# Patient Record
Sex: Male | Born: 1975 | Race: Black or African American | Hispanic: No | Smoking: Former smoker
Health system: Southern US, Community
[De-identification: ages and names within clinical notes are randomized; demographics above are authoritative.]

## PROBLEM LIST (undated history)

## (undated) DIAGNOSIS — E785 Hyperlipidemia, unspecified: Secondary | ICD-10-CM

## (undated) HISTORY — DX: Hyperlipidemia, unspecified: E78.5

---

## 2013-03-13 ENCOUNTER — Other Ambulatory Visit (HOSPITAL_COMMUNITY): Payer: Self-pay | Admitting: Physician Assistant

## 2013-03-13 ENCOUNTER — Ambulatory Visit (HOSPITAL_COMMUNITY)
Admission: RE | Admit: 2013-03-13 | Discharge: 2013-03-13 | Disposition: A | Discharge status: Home / Self Care | Payer: BC Managed Care – PPO | Source: Ambulatory Visit | Attending: Physician Assistant | Admitting: Physician Assistant

## 2013-03-13 DIAGNOSIS — Z87891 Personal history of nicotine dependence: Secondary | ICD-10-CM | POA: Insufficient documentation

## 2013-03-13 DIAGNOSIS — R7611 Nonspecific reaction to tuberculin skin test without active tuberculosis: Secondary | ICD-10-CM

## 2014-02-24 ENCOUNTER — Encounter: Payer: Self-pay | Admitting: *Deleted

## 2014-03-10 ENCOUNTER — Ambulatory Visit (INDEPENDENT_AMBULATORY_CARE_PROVIDER_SITE_OTHER): Payer: BC Managed Care – PPO | Admitting: Family Medicine

## 2014-03-10 ENCOUNTER — Encounter: Payer: Self-pay | Admitting: Family Medicine

## 2014-03-10 VITALS — BP 126/78 | HR 68 | Temp 98.1°F | Resp 18 | Ht 67.5 in | Wt 203.0 lb

## 2014-03-10 DIAGNOSIS — Z0000 Encounter for general adult medical examination without abnormal findings: Secondary | ICD-10-CM

## 2014-03-10 DIAGNOSIS — K219 Gastro-esophageal reflux disease without esophagitis: Secondary | ICD-10-CM

## 2014-03-10 DIAGNOSIS — E785 Hyperlipidemia, unspecified: Secondary | ICD-10-CM

## 2014-03-10 DIAGNOSIS — E669 Obesity, unspecified: Secondary | ICD-10-CM

## 2014-03-10 LAB — CBC WITH DIFFERENTIAL/PLATELET
BASOS PCT: 0 % (ref 0–1)
Basophils Absolute: 0 10*3/uL (ref 0.0–0.1)
EOS ABS: 0.1 10*3/uL (ref 0.0–0.7)
Eosinophils Relative: 3 % (ref 0–5)
HCT: 43.6 % (ref 39.0–52.0)
Hemoglobin: 14.8 g/dL (ref 13.0–17.0)
Lymphocytes Relative: 43 % (ref 12–46)
Lymphs Abs: 2.1 10*3/uL (ref 0.7–4.0)
MCH: 29.4 pg (ref 26.0–34.0)
MCHC: 33.9 g/dL (ref 30.0–36.0)
MCV: 86.5 fL (ref 78.0–100.0)
Monocytes Absolute: 0.5 10*3/uL (ref 0.1–1.0)
Monocytes Relative: 10 % (ref 3–12)
NEUTROS PCT: 44 % (ref 43–77)
Neutro Abs: 2.1 10*3/uL (ref 1.7–7.7)
PLATELETS: 252 10*3/uL (ref 150–400)
RBC: 5.04 MIL/uL (ref 4.22–5.81)
RDW: 14.7 % (ref 11.5–15.5)
WBC: 4.8 10*3/uL (ref 4.0–10.5)

## 2014-03-10 LAB — COMPREHENSIVE METABOLIC PANEL
ALT: 76 U/L — AB (ref 0–53)
AST: 53 U/L — AB (ref 0–37)
Albumin: 4.8 g/dL (ref 3.5–5.2)
Alkaline Phosphatase: 56 U/L (ref 39–117)
BILIRUBIN TOTAL: 0.9 mg/dL (ref 0.2–1.2)
BUN: 13 mg/dL (ref 6–23)
CO2: 25 mEq/L (ref 19–32)
Calcium: 9.7 mg/dL (ref 8.4–10.5)
Chloride: 103 mEq/L (ref 96–112)
Creat: 1.3 mg/dL (ref 0.50–1.35)
Glucose, Bld: 96 mg/dL (ref 70–99)
Potassium: 4.3 mEq/L (ref 3.5–5.3)
Sodium: 137 mEq/L (ref 135–145)
Total Protein: 7.3 g/dL (ref 6.0–8.3)

## 2014-03-10 LAB — LIPID PANEL
CHOL/HDL RATIO: 7.7 ratio
Cholesterol: 285 mg/dL — ABNORMAL HIGH (ref 0–200)
HDL: 37 mg/dL — ABNORMAL LOW (ref >39)
LDL CALC: 190 mg/dL — AB (ref 0–99)
Triglycerides: 292 mg/dL — ABNORMAL HIGH (ref <150)
VLDL: 58 mg/dL — AB (ref 0–40)

## 2014-03-10 MED ORDER — OMEPRAZOLE 40 MG PO CPDR: 40.0000 mg | DELAYED_RELEASE_CAPSULE | Freq: Every day | ORAL | Status: DC

## 2014-03-10 NOTE — Assessment & Plan Note (Signed)
We'll recheck his lipid panel decide on a statin drugs are needed. For now he can continue the niacin and fish oil

## 2014-03-10 NOTE — Patient Instructions (Signed)
I recommend eye visit once a year I recommend dental visit every 6 months Goal is to  Exercise 30 minutes 5 days a week We will send a letter with lab results  F/U as needed or in 6 months

## 2014-03-10 NOTE — Assessment & Plan Note (Signed)
Will have him use Prilosec 40 mg as needed, is already avoiding foods that cause irritation

## 2014-03-10 NOTE — Progress Notes (Signed)
Patient ID: Bob Phillips, male   DOB: 14-Dec-1975, 38 y.o.   MRN: [id number]   Subjective:    Patient ID: Bob Phillips, male    DOB: 14-Dec-1975, 38 y.o.   MRN: [id number]  Patient presents for New Patient CPE and Occasional Heart burn  patient here to establish care. [facility] he was also seen at the health department. He is currently in school for respiratory therapy. His [place] country is [place] His history of hyperlipidemia he states that he was told to take niacin and visual but it was not rechecked. He remembers his cholesterol was severely high as well as his LDL. He does have family history of coronary artery disease his father died with a myocardial infarction at age 36 his sisters also have hypertension. He's been trying to watch his diet and he started exercising again to lose some weight.  Acid reflux he's had heartburn problems on and off for quite some time. He has tried some over-the-counter Prilosec 20 mg which helps some. She tries to avoid the foods that irritation is acid reflux such as R. and red sauces. He denies any nausea vomiting denies any diarrhea.  Medications and history reviewed    Review Of Systems:  GEN- denies fatigue, fever, weight loss,weakness, recent illness HEENT- denies eye drainage, change in vision, nasal discharge, CVS- denies chest pain, palpitations RESP- denies SOB, cough, wheeze ABD- denies N/V, change in stools, abd pain GU- denies dysuria, hematuria, dribbling, incontinence MSK- denies joint pain, muscle aches, injury Neuro- denies headache, dizziness, syncope, seizure activity       Objective:    BP 126/78  Pulse 68  Temp(Src) 98.1 F (36.7 C) (Oral)  Resp 18  Ht 5' 7.5" (1.715 m)  Wt 203 lb (92.08 kg)  BMI 31.31 kg/m2 GEN- NAD, alert and oriented x3 HEENT- PERRL, EOMI, non injected sclera, pink conjunctiva, MMM, oropharynx clear, TM clear bilat no effusion Neck- Supple, no LAD CVS- RRR, no  murmur RESP-CTAB ABD-NABS,soft,NT,ND EXT- No edema Pulses- Radial 2+ Psych-normal affect and mood        Assessment & Plan:      Problem List Items Addressed This Visit   Routine general medical examination at a health care facility - Primary   Relevant Orders      CBC with Differential      Comprehensive metabolic panel   Obesity, unspecified   Hyperlipidemia   Relevant Orders      Lipid panel      Note: This dictation was prepared with Dragon dictation along with smaller phrase technology. Any transcriptional errors that result from this process are unintentional.

## 2014-03-10 NOTE — Assessment & Plan Note (Signed)
Fasting labs done. His tetanus booster immunizations are up to date. No family history of colon cancer or prostate cancer therefore he can wait until 7250 for colon cancer screening at age 38 for prostate cancer screening

## 2014-03-10 NOTE — Assessment & Plan Note (Signed)
Discussed dietary changes as well as exercise which he is already doing. His goal be to lose about 15-20 pound

## 2014-03-15 ENCOUNTER — Other Ambulatory Visit: Payer: Self-pay | Admitting: *Deleted

## 2014-03-15 MED ORDER — ATORVASTATIN CALCIUM 20 MG PO TABS: 20.0000 mg | ORAL_TABLET | Freq: Every day | ORAL | Status: DC

## 2014-04-04 ENCOUNTER — Encounter: Payer: Self-pay | Admitting: Family Medicine

## 2014-04-04 DIAGNOSIS — R945 Abnormal results of liver function studies: Principal | ICD-10-CM

## 2014-04-04 DIAGNOSIS — R7989 Other specified abnormal findings of blood chemistry: Secondary | ICD-10-CM

## 2014-05-06 ENCOUNTER — Other Ambulatory Visit: Payer: BC Managed Care – PPO

## 2014-05-06 DIAGNOSIS — R7989 Other specified abnormal findings of blood chemistry: Secondary | ICD-10-CM

## 2014-05-06 DIAGNOSIS — R945 Abnormal results of liver function studies: Principal | ICD-10-CM

## 2014-05-06 LAB — COMPLETE METABOLIC PANEL WITH GFR
ALK PHOS: 60 U/L (ref 39–117)
ALT: 114 U/L — ABNORMAL HIGH (ref 0–53)
AST: 52 U/L — AB (ref 0–37)
Albumin: 5.2 g/dL (ref 3.5–5.2)
BUN: 9 mg/dL (ref 6–23)
CO2: 27 mEq/L (ref 19–32)
Calcium: 10 mg/dL (ref 8.4–10.5)
Chloride: 100 mEq/L (ref 96–112)
Creat: 1.22 mg/dL (ref 0.50–1.35)
GFR, Est African American: 88 mL/min
GFR, Est Non African American: 76 mL/min
Glucose, Bld: 100 mg/dL — ABNORMAL HIGH (ref 70–99)
POTASSIUM: 4.6 meq/L (ref 3.5–5.3)
SODIUM: 138 meq/L (ref 135–145)
TOTAL PROTEIN: 7.5 g/dL (ref 6.0–8.3)
Total Bilirubin: 1 mg/dL (ref 0.2–1.2)

## 2014-05-07 ENCOUNTER — Other Ambulatory Visit: Payer: Self-pay | Admitting: *Deleted

## 2014-05-07 DIAGNOSIS — R7989 Other specified abnormal findings of blood chemistry: Secondary | ICD-10-CM

## 2014-05-07 DIAGNOSIS — R945 Abnormal results of liver function studies: Principal | ICD-10-CM

## 2014-05-08 ENCOUNTER — Encounter: Payer: Self-pay | Admitting: Family Medicine

## 2014-05-10 ENCOUNTER — Ambulatory Visit (HOSPITAL_COMMUNITY): Payer: BC Managed Care – PPO

## 2014-05-10 ENCOUNTER — Ambulatory Visit (HOSPITAL_COMMUNITY)
Admission: RE | Admit: 2014-05-10 | Discharge: 2014-05-10 | Disposition: A | Discharge status: Home / Self Care | Payer: BC Managed Care – PPO | Source: Ambulatory Visit | Attending: Family Medicine | Admitting: Family Medicine

## 2014-05-10 ENCOUNTER — Other Ambulatory Visit: Payer: Self-pay | Admitting: *Deleted

## 2014-05-10 ENCOUNTER — Other Ambulatory Visit (HOSPITAL_COMMUNITY): Payer: BC Managed Care – PPO

## 2014-05-10 DIAGNOSIS — K7689 Other specified diseases of liver: Secondary | ICD-10-CM | POA: Insufficient documentation

## 2014-05-10 DIAGNOSIS — R945 Abnormal results of liver function studies: Secondary | ICD-10-CM

## 2014-05-10 DIAGNOSIS — R7989 Other specified abnormal findings of blood chemistry: Secondary | ICD-10-CM | POA: Insufficient documentation

## 2014-05-10 MED ORDER — ATORVASTATIN CALCIUM 20 MG PO TABS: 20.0000 mg | ORAL_TABLET | Freq: Every day | ORAL | Status: DC

## 2014-05-10 MED ORDER — OMEPRAZOLE 40 MG PO CPDR: 40.0000 mg | DELAYED_RELEASE_CAPSULE | Freq: Every day | ORAL | Status: DC

## 2014-05-10 NOTE — Telephone Encounter (Signed)
Refill appropriate and filled per protocol. 

## 2014-05-11 ENCOUNTER — Other Ambulatory Visit: Payer: Self-pay | Admitting: *Deleted

## 2014-05-11 DIAGNOSIS — R7989 Other specified abnormal findings of blood chemistry: Secondary | ICD-10-CM

## 2014-05-11 DIAGNOSIS — R945 Abnormal results of liver function studies: Principal | ICD-10-CM

## 2014-05-11 MED ORDER — ATORVASTATIN CALCIUM 20 MG PO TABS: 20.0000 mg | ORAL_TABLET | Freq: Every day | ORAL | Status: DC

## 2014-05-12 ENCOUNTER — Other Ambulatory Visit (HOSPITAL_COMMUNITY): Payer: BC Managed Care – PPO

## 2014-06-07 ENCOUNTER — Other Ambulatory Visit: Payer: Self-pay | Admitting: Family Medicine

## 2014-06-07 LAB — HEPATIC FUNCTION PANEL
ALBUMIN: 4.9 g/dL (ref 3.5–5.2)
ALT: 78 U/L — AB (ref 0–53)
AST: 36 U/L (ref 0–37)
Alkaline Phosphatase: 61 U/L (ref 39–117)
BILIRUBIN TOTAL: 1.4 mg/dL — AB (ref 0.2–1.2)
Bilirubin, Direct: 0.3 mg/dL (ref 0.0–0.3)
Indirect Bilirubin: 1.1 mg/dL (ref 0.2–1.2)
Total Protein: 7.2 g/dL (ref 6.0–8.3)

## 2014-06-07 LAB — LIPID PANEL
CHOL/HDL RATIO: 2.9 ratio
Cholesterol: 102 mg/dL (ref 0–200)
HDL: 35 mg/dL — AB (ref >39)
LDL Cholesterol: 55 mg/dL (ref 0–99)
TRIGLYCERIDES: 60 mg/dL (ref <150)
VLDL: 12 mg/dL (ref 0–40)

## 2014-07-09 ENCOUNTER — Encounter: Payer: Self-pay | Admitting: Family Medicine

## 2014-08-06 ENCOUNTER — Telehealth: Payer: Self-pay | Admitting: Family Medicine

## 2014-08-06 NOTE — Telephone Encounter (Signed)
This is a side a affect but low percentage of this occuring, he can decrease to 1/2 tablet to see if this helps and he will need repeat Lipid function ,CMET after Nov 10 , his glucose will be rechecked at that time

## 2014-08-06 NOTE — Telephone Encounter (Signed)
Patient would like a call back regarding not feeling well with his medications, he would not get specific with ME. Only wanted to speak to nurse [phone]

## 2014-08-06 NOTE — Telephone Encounter (Signed)
Call placed to patient.   Reports that he is taking a statin to control his lipids, but he is having dry mouth and is noting that he is excessively thirsty.   States that he read that elevated glucose could be side effect.   MD please advise.

## 2014-08-06 NOTE — Telephone Encounter (Signed)
Call placed to patient and patient made aware.  

## 2014-09-27 ENCOUNTER — Telehealth: Payer: Self-pay | Admitting: Family Medicine

## 2014-09-27 DIAGNOSIS — Z79899 Other long term (current) drug therapy: Secondary | ICD-10-CM

## 2014-09-27 DIAGNOSIS — R945 Abnormal results of liver function studies: Secondary | ICD-10-CM

## 2014-09-27 DIAGNOSIS — E785 Hyperlipidemia, unspecified: Secondary | ICD-10-CM

## 2014-09-27 DIAGNOSIS — R7989 Other specified abnormal findings of blood chemistry: Secondary | ICD-10-CM

## 2014-09-27 NOTE — Telephone Encounter (Signed)
Want three month labs sent to [facility] in [place].

## 2014-09-28 ENCOUNTER — Other Ambulatory Visit: Payer: Self-pay | Admitting: Family Medicine

## 2014-09-29 LAB — LIPID PANEL
Cholesterol: 165 mg/dL (ref 0–200)
HDL: 43 mg/dL (ref >39)
LDL Cholesterol: 100 mg/dL — ABNORMAL HIGH (ref 0–99)
Total CHOL/HDL Ratio: 3.8 Ratio
Triglycerides: 109 mg/dL (ref <150)
VLDL: 22 mg/dL (ref 0–40)

## 2014-09-29 LAB — HEPATIC FUNCTION PANEL
ALT: 60 U/L — ABNORMAL HIGH (ref 0–53)
AST: 26 U/L (ref 0–37)
Albumin: 4.9 g/dL (ref 3.5–5.2)
Alkaline Phosphatase: 65 U/L (ref 39–117)
BILIRUBIN DIRECT: 0.2 mg/dL (ref 0.0–0.3)
BILIRUBIN INDIRECT: 0.8 mg/dL (ref 0.2–1.2)
Total Bilirubin: 1 mg/dL (ref 0.2–1.2)
Total Protein: 7.7 g/dL (ref 6.0–8.3)

## 2014-11-12 ENCOUNTER — Ambulatory Visit (INDEPENDENT_AMBULATORY_CARE_PROVIDER_SITE_OTHER): Payer: BLUE CROSS/BLUE SHIELD | Admitting: Family Medicine

## 2014-11-12 ENCOUNTER — Encounter: Payer: Self-pay | Admitting: Family Medicine

## 2014-11-12 VITALS — BP 130/74 | HR 68 | Temp 98.1°F | Resp 14 | Ht 68.0 in | Wt 190.0 lb

## 2014-11-12 DIAGNOSIS — K130 Diseases of lips: Secondary | ICD-10-CM

## 2014-11-12 MED ORDER — ATORVASTATIN CALCIUM 10 MG PO TABS: 10.0000 mg | ORAL_TABLET | Freq: Every day | ORAL | Status: DC

## 2014-11-12 NOTE — Progress Notes (Signed)
Patient ID: Bob Phillips, male   DOB: 1976/01/10, 39 y.o.   MRN: [id number]   Subjective:    Patient ID: Bob Phillips, male    DOB: 1976/01/10, 39 y.o.   MRN: [id number]  Patient presents for Blisters on lip  patient concerned with small little bumps that come up on the corner of his mouth he states they have been coming since November he has not seen any large blisters he states that his wife tried to look and did not notice anything. He also has had some sensitivity of his gums. No sore throat no recent illness no fever. He does get dry lips.    Review Of Systems:  GEN- denies fatigue, fever, weight loss,weakness, recent illness HEENT- denies eye drainage, change in vision, nasal discharge, CVS- denies chest pain, palpitations RESP- denies SOB, cough, wheeze ABD- denies N/V, change in stools, abd pain GU- denies dysuria, hematuria, dribbling, incontinence MSK- denies joint pain, muscle aches, injury Neuro- denies headache, dizziness, syncope, seizure activity       Objective:    BP 130/74 mmHg  Pulse 68  Temp(Src) 98.1 F (36.7 C) (Oral)  Resp 14  Ht 5\' 8"  (1.727 m)  Wt 190 lb (86.183 kg)  BMI 28.90 kg/m2 GEN- NAD, alert and oriented x3 HEENT- PERRL, EOMI, non injected sclera, pink conjunctiva, MMM, oropharynx clear, very tiny papule on right corner, dry skin, no blisters, no ulcers, no abnormal lesions Neck- Supple, no LAD         Assessment & Plan:      Problem List Items Addressed This Visit    None    Visit Diagnoses    Lesion of lip    -  Primary    I see no lesions of concern? small cold sore, check HSV discussed with pt, moisturze skin    Relevant Orders    HSV(herpes simplex vrs) 1+2 ab-IgG       Note: This dictation was prepared with Dragon dictation along with smaller phrase technology. Any transcriptional errors that result from this process are unintentional.

## 2014-11-12 NOTE — Patient Instructions (Signed)
Try the carmex  I will call with lab results F/U as previous

## 2014-11-15 LAB — HSV(HERPES SIMPLEX VRS) I + II AB-IGG: HSV 1 Glycoprotein G Ab, IgG: 8.16 IV — ABNORMAL HIGH

## 2014-11-22 ENCOUNTER — Other Ambulatory Visit: Payer: Self-pay | Admitting: Family Medicine

## 2014-11-22 MED ORDER — VALACYCLOVIR HCL 1 G PO TABS: 1000.0000 mg | ORAL_TABLET | Freq: Two times a day (BID) | ORAL | Status: DC

## 2015-03-30 ENCOUNTER — Encounter: Payer: Self-pay | Admitting: Family Medicine

## 2015-03-30 ENCOUNTER — Ambulatory Visit (INDEPENDENT_AMBULATORY_CARE_PROVIDER_SITE_OTHER): Payer: BLUE CROSS/BLUE SHIELD | Admitting: Family Medicine

## 2015-03-30 VITALS — BP 132/74 | HR 76 | Temp 98.3°F | Resp 12 | Ht 68.0 in | Wt 197.0 lb

## 2015-03-30 DIAGNOSIS — E785 Hyperlipidemia, unspecified: Secondary | ICD-10-CM | POA: Diagnosis not present

## 2015-03-30 DIAGNOSIS — K219 Gastro-esophageal reflux disease without esophagitis: Secondary | ICD-10-CM | POA: Diagnosis not present

## 2015-03-30 DIAGNOSIS — Z0000 Encounter for general adult medical examination without abnormal findings: Secondary | ICD-10-CM

## 2015-03-30 LAB — COMPREHENSIVE METABOLIC PANEL
ALK PHOS: 58 U/L (ref 39–117)
ALT: 74 U/L — ABNORMAL HIGH (ref 0–53)
AST: 39 U/L — AB (ref 0–37)
Albumin: 4.8 g/dL (ref 3.5–5.2)
BUN: 15 mg/dL (ref 6–23)
CALCIUM: 9.9 mg/dL (ref 8.4–10.5)
CO2: 26 mEq/L (ref 19–32)
Chloride: 102 mEq/L (ref 96–112)
Creat: 1.2 mg/dL (ref 0.50–1.35)
Glucose, Bld: 101 mg/dL — ABNORMAL HIGH (ref 70–99)
Potassium: 4.1 mEq/L (ref 3.5–5.3)
SODIUM: 138 meq/L (ref 135–145)
Total Bilirubin: 1.3 mg/dL — ABNORMAL HIGH (ref 0.2–1.2)
Total Protein: 7.5 g/dL (ref 6.0–8.3)

## 2015-03-30 LAB — CBC WITH DIFFERENTIAL/PLATELET
BASOS PCT: 0 % (ref 0–1)
Basophils Absolute: 0 10*3/uL (ref 0.0–0.1)
Eosinophils Absolute: 0.1 10*3/uL (ref 0.0–0.7)
Eosinophils Relative: 3 % (ref 0–5)
HCT: 45.1 % (ref 39.0–52.0)
HEMOGLOBIN: 15 g/dL (ref 13.0–17.0)
Lymphocytes Relative: 45 % (ref 12–46)
Lymphs Abs: 2.1 10*3/uL (ref 0.7–4.0)
MCH: 29.1 pg (ref 26.0–34.0)
MCHC: 33.3 g/dL (ref 30.0–36.0)
MCV: 87.4 fL (ref 78.0–100.0)
MONOS PCT: 7 % (ref 3–12)
MPV: 10.5 fL (ref 8.6–12.4)
Monocytes Absolute: 0.3 10*3/uL (ref 0.1–1.0)
Neutro Abs: 2.1 10*3/uL (ref 1.7–7.7)
Neutrophils Relative %: 45 % (ref 43–77)
PLATELETS: 265 10*3/uL (ref 150–400)
RBC: 5.16 MIL/uL (ref 4.22–5.81)
RDW: 14.2 % (ref 11.5–15.5)
WBC: 4.6 10*3/uL (ref 4.0–10.5)

## 2015-03-30 LAB — LIPID PANEL
CHOLESTEROL: 164 mg/dL (ref 0–200)
HDL: 41 mg/dL (ref >=40)
LDL Cholesterol: 96 mg/dL (ref 0–99)
TRIGLYCERIDES: 137 mg/dL (ref <150)
Total CHOL/HDL Ratio: 4 Ratio
VLDL: 27 mg/dL (ref 0–40)

## 2015-03-30 NOTE — Progress Notes (Signed)
Patient ID: Bob Phillips, male   DOB: 10-May-1976, 39 y.o.   MRN: [id number]   Subjective:    Patient ID: Bob Phillips, male    DOB: 10-May-1976, 39 y.o.   MRN: [id number]  Patient presents for CPE  He will be traveling to [place] for 2 weeks, needs malaria prophylaxis. Since our last visit he graduated from RT school, has job offer with [employer], wife is [redacted] weeks pregnant. Near end of semester he was not following diet or working out and has gained about 10lbs.   Due for fasting labs  Meds and history reviewed, does not need prilosec every day.    Review Of Systems:  GEN- denies fatigue, fever, weight loss,weakness, recent illness HEENT- denies eye drainage, change in vision, nasal discharge, CVS- denies chest pain, palpitations RESP- denies SOB, cough, wheeze ABD- denies N/V, change in stools, abd pain GU- denies dysuria, hematuria, dribbling, incontinence MSK- denies joint pain, muscle aches, injury Neuro- denies headache, dizziness, syncope, seizure activity       Objective:    BP 132/74 mmHg  Pulse 76  Temp(Src) 98.3 F (36.8 C) (Oral)  Resp 12  Ht 5\' 8"  (1.727 m)  Wt 197 lb (89.359 kg)  BMI 29.96 kg/m2 GEN- NAD, alert and oriented x3 HEENT- PERRL, EOMI, non injected sclera, pink conjunctiva, MMM, oropharynx clear Neck- Supple, no thyromegaly CVS- RRR, no murmur RESP-CTAB ABD-NABS,soft,NT,ND EXT- No edema Pulses- Radial, DP- 2+        Assessment & Plan:      Problem List Items Addressed This Visit    Routine general medical examination at a health care facility    CPE done, immunizations UTD, fasting labs to be done Continue lipitor and acid reflux meds as needed Will look into CDC for malaria prophylaxis for him and wife to [place]- Discussed healthy eating, need for weight loss        Relevant Orders   Comprehensive metabolic panel (Completed)   CBC with Differential/Platelet (Completed)   Hyperlipidemia - Primary   Relevant Orders   Lipid  panel (Completed)   GERD (gastroesophageal reflux disease)      Note: This dictation was prepared with Dragon dictation along with smaller phrase technology. Any transcriptional errors that result from this process are unintentional.

## 2015-03-30 NOTE — Patient Instructions (Signed)
I recommend eye visit once a year I recommend dental visit every 6 months Goal is to  Exercise 30 minutes 5 days a week We will send results by mychart I will contact you about malaria prophylaxis  F/U 1 year for physical

## 2015-03-31 ENCOUNTER — Encounter: Payer: Self-pay | Admitting: Family Medicine

## 2015-03-31 MED ORDER — ATORVASTATIN CALCIUM 10 MG PO TABS: 10.0000 mg | ORAL_TABLET | Freq: Every day | ORAL | Status: DC

## 2015-03-31 NOTE — Assessment & Plan Note (Signed)
CPE done, immunizations UTD, fasting labs to be done Continue lipitor and acid reflux meds as needed Will look into CDC for malaria prophylaxis for him and wife to [place]- Discussed healthy eating, need for weight loss

## 2015-04-06 ENCOUNTER — Telehealth: Payer: Self-pay | Admitting: Family Medicine

## 2015-04-06 MED ORDER — MEFLOQUINE HCL 250 MG PO TABS: 250.0000 mg | ORAL_TABLET | ORAL | Status: DC

## 2015-04-06 NOTE — Telephone Encounter (Signed)
Call placed to patient and patient made aware.  

## 2015-04-06 NOTE — Telephone Encounter (Signed)
Call pt, he needs to take Mefloquine - once a week, start 2 weeks before he lives for [place] and take while there and for 4 more weeks when he returns.

## 2015-05-28 ENCOUNTER — Emergency Department (HOSPITAL_COMMUNITY)
Admission: EM | Admit: 2015-05-28 | Discharge: 2015-05-28 | Disposition: A | Discharge status: Home / Self Care | Payer: BLUE CROSS/BLUE SHIELD | Attending: Emergency Medicine | Admitting: Emergency Medicine

## 2015-05-28 ENCOUNTER — Encounter (HOSPITAL_COMMUNITY): Payer: Self-pay | Admitting: *Deleted

## 2015-05-28 ENCOUNTER — Emergency Department (INDEPENDENT_AMBULATORY_CARE_PROVIDER_SITE_OTHER)
Admission: EM | Admit: 2015-05-28 | Discharge: 2015-05-28 | Disposition: A | Discharge status: Nursing Home (Includes ICF) | Payer: BLUE CROSS/BLUE SHIELD | Source: Home / Self Care | Attending: Family Medicine | Admitting: Family Medicine

## 2015-05-28 ENCOUNTER — Encounter (HOSPITAL_COMMUNITY): Payer: Self-pay | Admitting: Emergency Medicine

## 2015-05-28 DIAGNOSIS — E785 Hyperlipidemia, unspecified: Secondary | ICD-10-CM | POA: Insufficient documentation

## 2015-05-28 DIAGNOSIS — Z87891 Personal history of nicotine dependence: Secondary | ICD-10-CM | POA: Insufficient documentation

## 2015-05-28 DIAGNOSIS — R509 Fever, unspecified: Secondary | ICD-10-CM

## 2015-05-28 DIAGNOSIS — Z79899 Other long term (current) drug therapy: Secondary | ICD-10-CM | POA: Diagnosis not present

## 2015-05-28 DIAGNOSIS — Z789 Other specified health status: Secondary | ICD-10-CM

## 2015-05-28 LAB — URINALYSIS, ROUTINE W REFLEX MICROSCOPIC
Bilirubin Urine: NEGATIVE
Glucose, UA: NEGATIVE mg/dL
Hgb urine dipstick: NEGATIVE
KETONES UR: NEGATIVE mg/dL
LEUKOCYTES UA: NEGATIVE
Nitrite: NEGATIVE
PH: 6.5 (ref 5.0–8.0)
Protein, ur: NEGATIVE mg/dL
Specific Gravity, Urine: 1.011 (ref 1.005–1.030)
UROBILINOGEN UA: 1 mg/dL (ref 0.0–1.0)

## 2015-05-28 LAB — CBC WITH DIFFERENTIAL/PLATELET
Basophils Absolute: 0 10*3/uL (ref 0.0–0.1)
Basophils Relative: 0 % (ref 0–1)
EOS ABS: 0.1 10*3/uL (ref 0.0–0.7)
EOS PCT: 2 % (ref 0–5)
HCT: 41.9 % (ref 39.0–52.0)
Hemoglobin: 14.1 g/dL (ref 13.0–17.0)
LYMPHS PCT: 27 % (ref 12–46)
Lymphs Abs: 1.3 10*3/uL (ref 0.7–4.0)
MCH: 29.1 pg (ref 26.0–34.0)
MCHC: 33.7 g/dL (ref 30.0–36.0)
MCV: 86.6 fL (ref 78.0–100.0)
MONO ABS: 0.5 10*3/uL (ref 0.1–1.0)
MONOS PCT: 11 % (ref 3–12)
Neutro Abs: 2.9 10*3/uL (ref 1.7–7.7)
Neutrophils Relative %: 60 % (ref 43–77)
PLATELETS: 230 10*3/uL (ref 150–400)
RBC: 4.84 MIL/uL (ref 4.22–5.81)
RDW: 13.5 % (ref 11.5–15.5)
WBC: 4.9 10*3/uL (ref 4.0–10.5)

## 2015-05-28 LAB — BASIC METABOLIC PANEL
Anion gap: 12 (ref 5–15)
BUN: 6 mg/dL (ref 6–20)
CALCIUM: 9.3 mg/dL (ref 8.9–10.3)
CHLORIDE: 96 mmol/L — AB (ref 101–111)
CO2: 27 mmol/L (ref 22–32)
Creatinine, Ser: 1.49 mg/dL — ABNORMAL HIGH (ref 0.61–1.24)
GFR calc Af Amer: >60 mL/min (ref >60)
GFR calc non Af Amer: 59 mL/min — ABNORMAL LOW (ref >60)
GLUCOSE: 103 mg/dL — AB (ref 65–99)
Potassium: 4.1 mmol/L (ref 3.5–5.1)
Sodium: 135 mmol/L (ref 135–145)

## 2015-05-28 MED ORDER — CHLOROQUINE PHOSPHATE 250 MG PO TABS: 250.0000 mg | ORAL_TABLET | Freq: Every day | ORAL | Status: DC

## 2015-05-28 MED ORDER — IBUPROFEN 800 MG PO TABS
800.0000 mg | ORAL_TABLET | Freq: Once | ORAL | Status: AC
  Administered 2015-05-28: 800 mg via ORAL
  Filled 2015-05-28: qty 1

## 2015-05-28 NOTE — ED Notes (Signed)
Pt reports fever, chills, headache, diarhea, generalized bodyaches and recently traveled to [place]. Pt states that he did not take his malaria medications prior to traveling. Pt states that this has been ongoing for 4 days.

## 2015-05-28 NOTE — ED Notes (Signed)
PA notified of fever, stated that the pt does not need IV or fluids at this time. Order to give pt ibuprofen

## 2015-05-28 NOTE — ED Provider Notes (Signed)
CSN: [id number]     Arrival date & time 05/28/15  1515 History   First MD Initiated Contact with Patient 05/28/15 1630     Chief Complaint  Patient presents with  . URI   (Consider location/radiation/quality/duration/timing/severity/associated sxs/prior Treatment) HPI Comments: Patient returned from [place] last pm. He started having high fevers and headaches on the plane back up to 102. He has a stiff neck as well, but contributes this to the pillow he used. He did not take pre-trip medication as he has done in the past. His wife did take these and has no symptoms. He denies fatigue, diarrhea or abd pain. No cough or congestion.   Patient is a 39 y.o. male presenting with URI. The history is provided by the patient.  URI   Past Medical History  Diagnosis Date  . Hyperlipidemia    History reviewed. No pertinent past surgical history. Family History  Problem Relation Age of Onset  . Heart disease Father   . Hypertension Father   . Cancer Mother 32    Leukemia  . Hypertension Sister   . Hyperlipidemia Sister   . Glaucoma Sister   . Glaucoma     History  Substance Use Topics  . Smoking status: Former Smoker    Types: Cigarettes    Quit date: 08/29/2012  . Smokeless tobacco: Never Used  . Alcohol Use: 12.6 oz/week    2 Glasses of wine, 14 Cans of beer, 5 Shots of liquor, 0 Standard drinks or equivalent per week    Review of Systems  All other systems reviewed and are negative.   Allergies  Review of patient's allergies indicates no known allergies.  Home Medications   Prior to Admission medications   Medication Sig Start Date End Date Taking? Authorizing Provider  atorvastatin (LIPITOR) 10 MG tablet Take 1 tablet (10 mg total) by mouth daily. 03/31/15   Salley Scarlet, MD  Fish Oil-Cholecalciferol (FISH OIL + D3) 1200-1000 MG-UNIT CAPS Take 1 capsule by mouth daily.    Historical Provider, MD  mefloquine (LARIAM) 250 MG tablet Take 1 tablet (250 mg total) by mouth every  7 (seven) days. 04/06/15   Salley Scarlet, MD  omeprazole (PRILOSEC) 40 MG capsule Take 1 capsule (40 mg total) by mouth daily. 05/10/14   Salley Scarlet, MD   BP 130/84 mmHg  Pulse 85  Temp(Src) 100.2 F (37.9 C) (Oral)  Resp 20  SpO2 100% Physical Exam  Constitutional: He is oriented to person, place, and time. He appears well-developed and well-nourished. No distress.  HENT:  Head: Normocephalic and atraumatic.  Neurological: He is alert and oriented to person, place, and time.  Skin: He is not diaphoretic.  Psychiatric: His behavior is normal.  Nursing note and vitals reviewed.   ED Course  Procedures (including critical care time) Labs Review Labs Reviewed - No data to display  Imaging Review No results found.   MDM   1. Other specified fever   2. Recent foreign travel    Discussed with Dr. Artis Flock. Because of travel and no pre travel medication, will send to the ER for ID work up. Patient and wife understands the need for this.    Riki Sheer, PA-C 05/28/15 [phone]

## 2015-05-28 NOTE — ED Provider Notes (Signed)
The patient is a 39 year old male who recently returned from [place] on an airplane, states he has had fever over the last 24 hours, he denies any other symptoms whatsoever including myalgias, arthralgias, headache, blurred vision, neck pain, back pain, chest pain, shortness of breath, cough, sore throat, sinus pressure, belly pain, diarrhea, dysuria, rash or any other complaints. He does not remember being bitten by any insects. Those around him are not sick. He denies leg swelling. The patient is otherwise healthy and on exam has a soft abdomen, clear heart and lung sounds, no peripheral edema, no rashes, he is totally well-appearing. Labs have been reviewed, no leukocytosis, normal renal function, urinalysis is normal, initial malaria smear was reported as normal. Patient was informed of the results, will be given up her prescription for the medications to be used if test returned positive. He is in agreement to return should symptoms worsen.  Meds given in ED:  Medications  ibuprofen (ADVIL,MOTRIN) tablet 800 mg (800 mg Oral Given 05/28/15 1942)    Discharge Medication List as of 05/28/2015  9:15 PM    START taking these medications   Details  chloroquine (ARALEN) 250 MG tablet Take 1 tablet (250 mg total) by mouth daily., Starting 05/28/2015, Until Discontinued, Print        Medical screening examination/treatment/procedure(s) were conducted as a shared visit with non-physician practitioner(s) and myself.  I personally evaluated the patient during the encounter.  Clinical Impression:   Final diagnoses:  Fever, unspecified fever cause         Eber Hong, MD 05/29/15 1357

## 2015-05-28 NOTE — ED Notes (Signed)
Pt c/o fevers associated w/chills and HA Reports he just returned from [place] last night Taking tyle and aleve w/temp relief Alert, no signs of acute distress.

## 2015-05-28 NOTE — ED Provider Notes (Signed)
CSN: [id number]     Arrival date & time 05/28/15  1715 History   First MD Initiated Contact with Patient 05/28/15 1809     Chief Complaint  Patient presents with  . Fever     (Consider location/radiation/quality/duration/timing/severity/associated sxs/prior Treatment) HPI Comments: Patient and his wife traveled to [place] and returned 4 days ago. They only visits cities in [place] and he admits to not taking malaria medication. He had a stiff neck after flying but this has resolved and he attributes this to his sleeping position on the plane. No other associated symptoms.   Patient is a 39 y.o. male presenting with fever. The history is provided by the patient. No language interpreter was used.  Fever Max temp prior to arrival:  102.7 Temp source:  Oral Severity:  Moderate Onset quality:  Gradual Duration:  4 days Timing:  Constant Progression:  Unchanged Chronicity:  New Relieved by:  Nothing Worsened by:  Nothing tried Ineffective treatments:  Acetaminophen Associated symptoms: no chest pain, no chills, no diarrhea, no dysuria, no nausea and no vomiting   Risk factors: recent travel   Risk factors: no hx of cancer, no immunosuppression, no occupational exposure, no recent surgery and no sick contacts     Past Medical History  Diagnosis Date  . Hyperlipidemia    History reviewed. No pertinent past surgical history. Family History  Problem Relation Age of Onset  . Heart disease Father   . Hypertension Father   . Cancer Mother 60    Leukemia  . Hypertension Sister   . Hyperlipidemia Sister   . Glaucoma Sister   . Glaucoma     History  Substance Use Topics  . Smoking status: Former Smoker    Types: Cigarettes    Quit date: 08/29/2012  . Smokeless tobacco: Never Used  . Alcohol Use: 12.6 oz/week    2 Glasses of wine, 14 Cans of beer, 5 Shots of liquor, 0 Standard drinks or equivalent per week    Review of Systems  Constitutional: Positive for fever. Negative for  chills and fatigue.  HENT: Negative for trouble swallowing.   Eyes: Negative for visual disturbance.  Respiratory: Negative for shortness of breath.   Cardiovascular: Negative for chest pain and palpitations.  Gastrointestinal: Negative for nausea, vomiting, abdominal pain and diarrhea.  Genitourinary: Negative for dysuria and difficulty urinating.  Musculoskeletal: Negative for arthralgias and neck pain.  Skin: Negative for color change.  Neurological: Negative for dizziness and weakness.  Psychiatric/Behavioral: Negative for dysphoric mood.      Allergies  Review of patient's allergies indicates no known allergies.  Home Medications   Prior to Admission medications   Medication Sig Start Date End Date Taking? Authorizing Provider  atorvastatin (LIPITOR) 10 MG tablet Take 1 tablet (10 mg total) by mouth daily. 03/31/15   Salley Scarlet, MD  Fish Oil-Cholecalciferol (FISH OIL + D3) 1200-1000 MG-UNIT CAPS Take 1 capsule by mouth daily.    Historical Provider, MD  mefloquine (LARIAM) 250 MG tablet Take 1 tablet (250 mg total) by mouth every 7 (seven) days. 04/06/15   Salley Scarlet, MD  omeprazole (PRILOSEC) 40 MG capsule Take 1 capsule (40 mg total) by mouth daily. 05/10/14   Salley Scarlet, MD   BP 122/85 mmHg  Pulse 77  Resp 21  SpO2 100% Physical Exam  Constitutional: He is oriented to person, place, and time. He appears well-developed and well-nourished. No distress.  HENT:  Head: Normocephalic and atraumatic.  Eyes: Conjunctivae and  EOM are normal.  Neck: Normal range of motion.  Cardiovascular: Normal rate and regular rhythm.  Exam reveals no gallop and no friction rub.   No murmur heard. Pulmonary/Chest: Effort normal and breath sounds normal. He has no wheezes. He has no rales. He exhibits no tenderness.  Abdominal: Soft. He exhibits no distension. There is no tenderness. There is no rebound.  Musculoskeletal: Normal range of motion.  Neurological: He is alert and  oriented to person, place, and time. Coordination normal.  Speech is goal-oriented. Moves limbs without ataxia.   Skin: Skin is warm and dry.  Psychiatric: He has a normal mood and affect. His behavior is normal.  Nursing note and vitals reviewed.   ED Course  Procedures (including critical care time) Labs Review Labs Reviewed  BASIC METABOLIC PANEL - Abnormal; Notable for the following:    Chloride 96 (*)    Glucose, Bld 103 (*)    Creatinine, Ser 1.49 (*)    GFR calc non Af Amer 59 (*)    All other components within normal limits  MALARIA SMEAR  CBC WITH DIFFERENTIAL/PLATELET  URINALYSIS, ROUTINE W REFLEX MICROSCOPIC (NOT AT [facility])    Imaging Review No results found.   EKG Interpretation None      MDM   Final diagnoses:  Fever, unspecified fever cause    6:28 PM Labs pending. Patient's temperature 100.2 at this time with remaining vitals stable.   9:15 PM Patient's temperature elevated to 102.9 while here and improved with ibuprofen. Labs unremarkable for acute changes. Patient's malaria smear negative so far. Patient will be discharged with chloroquine for treatment if needed. Patient will be contacted if positive. No meningeal signs of any other symptoms so suggest another illness. Patient instructed to return with worsening or concerning symptoms.    [address] [place], PA-C 05/29/15 0038  Eber Hong, MD 05/29/15 1357

## 2015-05-28 NOTE — Discharge Instructions (Signed)
Take  ibuprofen every 8 hours as needed for fever. You may also take tylenol as needed for fever. If malaria is positive, start taking chloroquine as directed.

## 2015-05-28 NOTE — ED Notes (Signed)
Pt verbalized understanding of d/c instructions and has no further questions. Pt stable and NAD.  

## 2015-05-30 ENCOUNTER — Encounter: Payer: Self-pay | Admitting: Family Medicine

## 2015-05-30 ENCOUNTER — Ambulatory Visit (INDEPENDENT_AMBULATORY_CARE_PROVIDER_SITE_OTHER): Payer: BLUE CROSS/BLUE SHIELD | Admitting: Family Medicine

## 2015-05-30 VITALS — BP 126/70 | HR 78 | Temp 99.3°F | Resp 16 | Ht 68.0 in | Wt 191.0 lb

## 2015-05-30 DIAGNOSIS — R7989 Other specified abnormal findings of blood chemistry: Secondary | ICD-10-CM | POA: Diagnosis not present

## 2015-05-30 DIAGNOSIS — L509 Urticaria, unspecified: Secondary | ICD-10-CM

## 2015-05-30 DIAGNOSIS — R945 Abnormal results of liver function studies: Secondary | ICD-10-CM

## 2015-05-30 DIAGNOSIS — R509 Fever, unspecified: Secondary | ICD-10-CM

## 2015-05-30 LAB — COMPREHENSIVE METABOLIC PANEL
ALBUMIN: 4.7 g/dL (ref 3.6–5.1)
ALT: 144 U/L — AB (ref 9–46)
AST: 99 U/L — ABNORMAL HIGH (ref 10–40)
Alkaline Phosphatase: 85 U/L (ref 40–115)
BILIRUBIN TOTAL: 0.9 mg/dL (ref 0.2–1.2)
BUN: 9 mg/dL (ref 7–25)
CHLORIDE: 101 mmol/L (ref 98–110)
CO2: 25 mmol/L (ref 20–31)
Calcium: 9.7 mg/dL (ref 8.6–10.3)
Creat: 1.25 mg/dL (ref 0.60–1.35)
Glucose, Bld: 100 mg/dL — ABNORMAL HIGH (ref 70–99)
POTASSIUM: 4.5 mmol/L (ref 3.5–5.3)
SODIUM: 140 mmol/L (ref 135–146)
Total Protein: 7.9 g/dL (ref 6.1–8.1)

## 2015-05-30 LAB — CBC WITH DIFFERENTIAL/PLATELET
Basophils Absolute: 0 K/uL (ref 0.0–0.1)
Basophils Relative: 1 % (ref 0–1)
Eosinophils Absolute: 0.1 K/uL (ref 0.0–0.7)
Eosinophils Relative: 3 % (ref 0–5)
HCT: 43.6 % (ref 39.0–52.0)
Hemoglobin: 14.7 g/dL (ref 13.0–17.0)
Lymphocytes Relative: 27 % (ref 12–46)
Lymphs Abs: 1.1 K/uL (ref 0.7–4.0)
MCH: 29.1 pg (ref 26.0–34.0)
MCHC: 33.7 g/dL (ref 30.0–36.0)
MCV: 86.2 fL (ref 78.0–100.0)
MPV: 9.9 fL (ref 8.6–12.4)
Monocytes Absolute: 0.7 K/uL (ref 0.1–1.0)
Monocytes Relative: 18 % — ABNORMAL HIGH (ref 3–12)
Neutro Abs: 2 K/uL (ref 1.7–7.7)
Neutrophils Relative %: 51 % (ref 43–77)
Platelets: 315 K/uL (ref 150–400)
RBC: 5.06 MIL/uL (ref 4.22–5.81)
RDW: 14.1 % (ref 11.5–15.5)
WBC: 4 K/uL (ref 4.0–10.5)

## 2015-05-30 LAB — MALARIA SMEAR: Special Requests: NORMAL

## 2015-05-30 NOTE — Patient Instructions (Signed)
We will call with lab results F/U pending results  

## 2015-05-30 NOTE — Progress Notes (Addendum)
Patient ID: Bob Phillips, male   DOB: Oct 22, 1976, 39 y.o.   MRN: [id number]   Subjective:    Patient ID: Bob Phillips, male    DOB: June 01, 1976, 39 y.o.   MRN: [id number]  Patient presents for ER F/U  patient here to follow-up ER visit. He actually returned from [place] on Friday the last day he was leaving kidney which would then Thursday the 28th he began to run a fever. He did not take malaria prophylactic medication states that he fell he was immune to everything. His wife did take the medication. He was still hasn't significantly high fever when he returned to the [place] he went to an urgent care however they directed him to the emergency department. Peripheral smear was done that did not show any evidence of malaria. His complete blood count was normal his metabolic panel showed acute renal insufficiency. He was sent home to start malaria treatment however the pharmacy did not have the chloroquine so he has not started this. His fever broke over the weekend however early this morning he noticed an itchy rash on his legs he then noticed that went away and it moved to his abdomen and then his other leg (Pictures seen). He does not have any rash currently. He states that he feels fine at this point. His energy is back into appetite is back. He has not had a nausea vomiting or diarrhea no joint pain or swelling. He states that he drank bottled water the entire time he was brought and does not remember being bitten by any insects or mosquitoes.    Review Of Systems:  GEN- denies fatigue, +fever, weight loss,weakness, recent illness HEENT- denies eye drainage, change in vision, nasal discharge, CVS- denies chest pain, palpitations RESP- denies SOB, cough, wheeze ABD- denies N/V, change in stools, abd pain GU- denies dysuria, hematuria, dribbling, incontinence MSK- denies joint pain, muscle aches, injury Neuro- denies headache, dizziness, syncope, seizure activity       Objective:    BP  126/70 mmHg  Pulse 78  Temp(Src) 99.3 F (37.4 C) (Oral)  Resp 16  Ht  (1.727 m)  Wt 191 lb (86.637 kg)  BMI 29.05 kg/m2 GEN- NAD, alert and oriented x3,well appearing  HEENT- PERRL, EOMI, non injected sclera, pink conjunctiva, MMM, oropharynx clear Neck- Supple, no LAD CVS- RRR, no murmur RESP-CTAB ABD-NABS,soft,NT,ND, no HSM Skin- intact , no urticarial rash, no petechia EXT- No edema Pulses- Radial,   2+        Assessment & Plan:      Problem List Items Addressed This Visit    None    Visit Diagnoses    Fever, unknown origin    -  Primary    Unknown cause of the fever and rash. Most likely viral but as he was and a foreign country that differential viral list is long. I reviewed the CDC website of concern were the cecum virus, malaria, typhoid, Dengue, [place] Virus however his symptoms did not truly fit into any of these with the exception of possible malaria though there was no evidence of this and his blood smears. His fever appears to forget however he he was taking fever reducers yesterday so I may not have an accurate picture. On exam he looks very well. As far as the fleeting urticarial rash this also does not quite fit the above diagnoses. Mostlysuggest some type of viral illness. I did repeat his labs today as well as draw blood cultures. He is  going to see occupational health tomorrow. He is not having any GI symptoms to suggest typhoid.   For now labs are pending, I agree to take Malaria medication    Relevant Orders    CBC with Differential/Platelet    Comprehensive metabolic panel    Culture, blood (single)    Culture, blood (single)    Urticarial rash           Note: This dictation was prepared with Dragon dictation along with smaller phrase technology. Any transcriptional errors that result from this process are unintentional.

## 2015-05-31 ENCOUNTER — Telehealth: Payer: Self-pay | Admitting: Family Medicine

## 2015-05-31 ENCOUNTER — Encounter: Payer: Self-pay | Admitting: Family Medicine

## 2015-05-31 NOTE — Telephone Encounter (Signed)
Letter faxed to [facility], [employer].

## 2015-05-31 NOTE — Telephone Encounter (Signed)
No evidence of malaria on the smears, okay to send letter stating no Malaria

## 2015-05-31 NOTE — Telephone Encounter (Signed)
MD please advise

## 2015-05-31 NOTE — Telephone Encounter (Signed)
Patient is calling to say that he needs a letter saying that he does not have malaria in order to go back to work tomorrow, he needs this done by FIVE today if possible       [phone] fax number  [phone] patients number if any questions

## 2015-05-31 NOTE — Addendum Note (Signed)
Addended by: Milinda Antis F on: 05/31/2015 01:41 PM   Modules accepted: Orders

## 2015-06-04 LAB — CULTURE, BLOOD (SINGLE): Organism ID, Bacteria: NO GROWTH

## 2015-06-17 ENCOUNTER — Other Ambulatory Visit: Payer: Self-pay | Admitting: Family Medicine

## 2015-06-17 LAB — COMPREHENSIVE METABOLIC PANEL
ALBUMIN: 4.6 g/dL (ref 3.6–5.1)
ALT: 32 U/L (ref 9–46)
AST: 18 U/L (ref 10–40)
Alkaline Phosphatase: 74 U/L (ref 40–115)
BILIRUBIN TOTAL: 0.8 mg/dL (ref 0.2–1.2)
BUN: 12 mg/dL (ref 7–25)
CHLORIDE: 100 mmol/L (ref 98–110)
CO2: 27 mmol/L (ref 20–31)
Calcium: 9.7 mg/dL (ref 8.6–10.3)
Creat: 1.19 mg/dL (ref 0.60–1.35)
GLUCOSE: 87 mg/dL (ref 70–99)
Potassium: 4.7 mmol/L (ref 3.5–5.3)
Sodium: 136 mmol/L (ref 135–146)
Total Protein: 7.5 g/dL (ref 6.1–8.1)

## 2015-08-10 ENCOUNTER — Other Ambulatory Visit: Payer: Self-pay | Admitting: *Deleted

## 2015-08-10 MED ORDER — OMEPRAZOLE 40 MG PO CPDR: 40.0000 mg | DELAYED_RELEASE_CAPSULE | Freq: Every day | ORAL | Status: DC

## 2015-08-10 NOTE — Telephone Encounter (Signed)
Received fax requesting refill on Omeprazole.   Refill appropriate and filled per protocol. 

## 2015-08-19 ENCOUNTER — Encounter: Payer: Self-pay | Admitting: Family Medicine

## 2015-11-11 ENCOUNTER — Other Ambulatory Visit: Payer: Self-pay | Admitting: Family Medicine

## 2015-11-11 NOTE — Telephone Encounter (Signed)
Medication refilled per protocol. 

## 2015-12-14 ENCOUNTER — Telehealth: Payer: BLUE CROSS/BLUE SHIELD | Admitting: Family

## 2015-12-14 DIAGNOSIS — A499 Bacterial infection, unspecified: Secondary | ICD-10-CM

## 2015-12-14 DIAGNOSIS — J329 Chronic sinusitis, unspecified: Secondary | ICD-10-CM

## 2015-12-14 DIAGNOSIS — B9689 Other specified bacterial agents as the cause of diseases classified elsewhere: Secondary | ICD-10-CM

## 2015-12-14 MED ORDER — AMOXICILLIN-POT CLAVULANATE 875-125 MG PO TABS: 1.0000 | ORAL_TABLET | Freq: Two times a day (BID) | ORAL | Status: AC

## 2015-12-14 NOTE — Progress Notes (Signed)

## 2016-03-21 ENCOUNTER — Ambulatory Visit (INDEPENDENT_AMBULATORY_CARE_PROVIDER_SITE_OTHER): Payer: BLUE CROSS/BLUE SHIELD | Admitting: Family Medicine

## 2016-03-21 DIAGNOSIS — Z23 Encounter for immunization: Secondary | ICD-10-CM

## 2016-04-18 ENCOUNTER — Ambulatory Visit (INDEPENDENT_AMBULATORY_CARE_PROVIDER_SITE_OTHER): Payer: BLUE CROSS/BLUE SHIELD | Admitting: Family Medicine

## 2016-04-18 ENCOUNTER — Encounter: Payer: Self-pay | Admitting: Family Medicine

## 2016-04-18 VITALS — BP 118/64 | HR 78 | Temp 98.1°F | Resp 12 | Ht 68.0 in | Wt 189.0 lb

## 2016-04-18 DIAGNOSIS — E785 Hyperlipidemia, unspecified: Secondary | ICD-10-CM

## 2016-04-18 DIAGNOSIS — M10071 Idiopathic gout, right ankle and foot: Secondary | ICD-10-CM | POA: Diagnosis not present

## 2016-04-18 DIAGNOSIS — K219 Gastro-esophageal reflux disease without esophagitis: Secondary | ICD-10-CM

## 2016-04-18 DIAGNOSIS — Z0000 Encounter for general adult medical examination without abnormal findings: Secondary | ICD-10-CM

## 2016-04-18 LAB — CBC WITH DIFFERENTIAL/PLATELET
Basophils Absolute: 42 cells/uL (ref 0–200)
Basophils Relative: 1 %
EOS PCT: 3 %
Eosinophils Absolute: 126 cells/uL (ref 15–500)
HCT: 42.3 % (ref 38.5–50.0)
Hemoglobin: 14.3 g/dL (ref 13.0–17.0)
LYMPHS PCT: 44 %
Lymphs Abs: 1848 cells/uL (ref 850–3900)
MCH: 29.7 pg (ref 27.0–33.0)
MCHC: 33.8 g/dL (ref 32.0–36.0)
MCV: 87.9 fL (ref 80.0–100.0)
MONOS PCT: 7 %
MPV: 10.3 fL (ref 7.5–12.5)
Monocytes Absolute: 294 cells/uL (ref 200–950)
NEUTROS PCT: 45 %
Neutro Abs: 1890 cells/uL (ref 1500–7800)
PLATELETS: 257 10*3/uL (ref 140–400)
RBC: 4.81 MIL/uL (ref 4.20–5.80)
RDW: 14.4 % (ref 11.0–15.0)
WBC: 4.2 10*3/uL (ref 3.8–10.8)

## 2016-04-18 LAB — COMPREHENSIVE METABOLIC PANEL
ALT: 73 U/L — ABNORMAL HIGH (ref 9–46)
AST: 38 U/L (ref 10–40)
Albumin: 4.8 g/dL (ref 3.6–5.1)
Alkaline Phosphatase: 56 U/L (ref 40–115)
BILIRUBIN TOTAL: 0.9 mg/dL (ref 0.2–1.2)
BUN: 13 mg/dL (ref 7–25)
CO2: 27 mmol/L (ref 20–31)
CREATININE: 1.17 mg/dL (ref 0.60–1.35)
Calcium: 10 mg/dL (ref 8.6–10.3)
Chloride: 104 mmol/L (ref 98–110)
GLUCOSE: 97 mg/dL (ref 70–99)
Potassium: 4.4 mmol/L (ref 3.5–5.3)
SODIUM: 140 mmol/L (ref 135–146)
Total Protein: 7.6 g/dL (ref 6.1–8.1)

## 2016-04-18 LAB — LIPID PANEL
Cholesterol: 162 mg/dL (ref 125–200)
HDL: 53 mg/dL (ref >=40)
LDL CALC: 88 mg/dL (ref <130)
Total CHOL/HDL Ratio: 3.1 Ratio (ref <=5.0)
Triglycerides: 104 mg/dL (ref <150)
VLDL: 21 mg/dL (ref <30)

## 2016-04-18 LAB — TSH: TSH: 1.03 mIU/L (ref 0.40–4.50)

## 2016-04-18 LAB — URIC ACID: Uric Acid, Serum: 7.2 mg/dL (ref 4.0–8.0)

## 2016-04-18 IMAGING — US US ABDOMEN LIMITED
1 series · 14 of 25 positions shown · non-contrast
Comparison: None.

CLINICAL DATA: Elevated liver function tests.

EXAM:
US ABDOMEN LIMITED - RIGHT UPPER QUADRANT

[Series 1: us abdomen limited · 0.21mm/px · 14 of 56 slices shown]
[im 1/56]
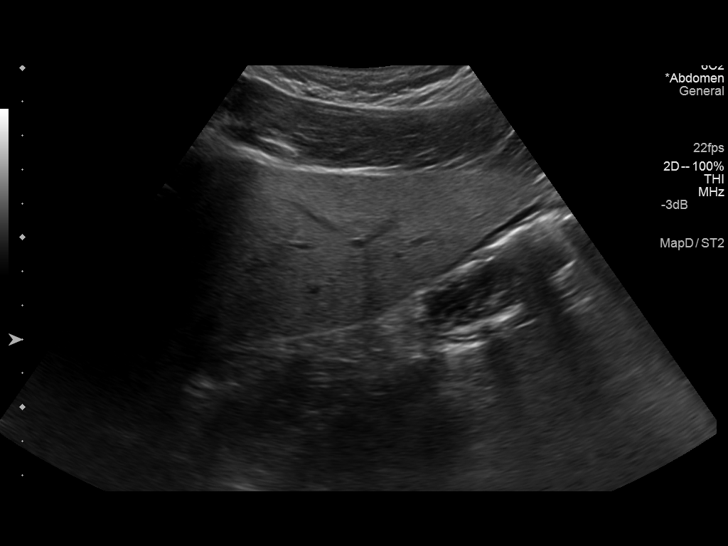
[im 5/56]
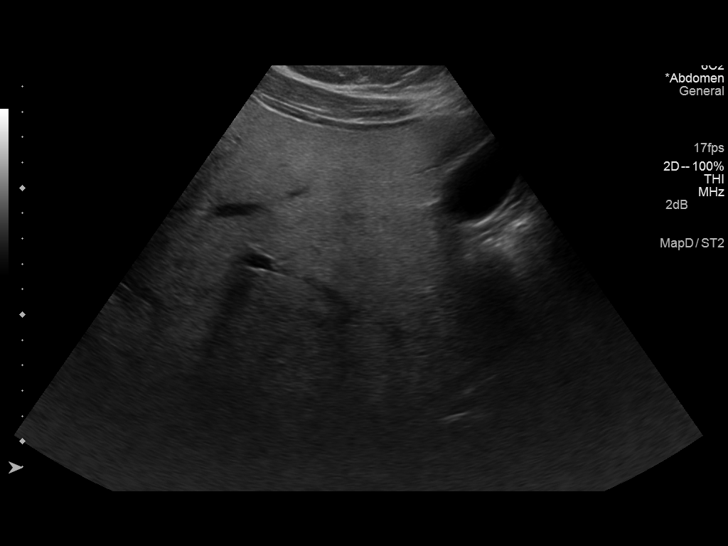
[im 10/56]
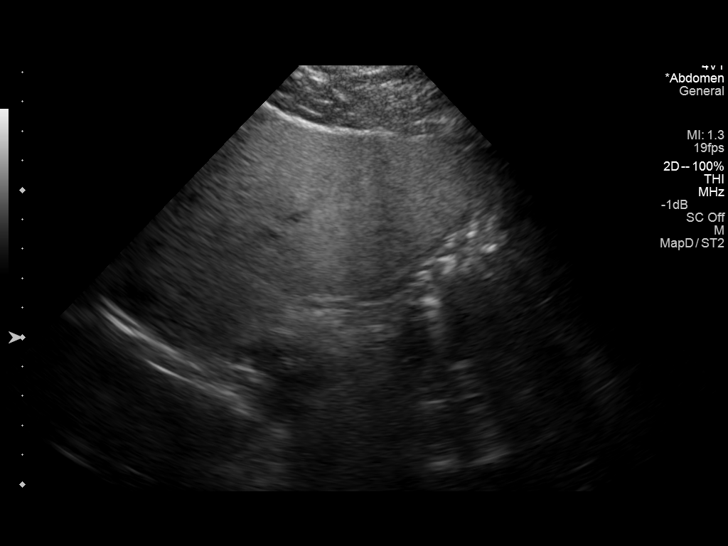
[im 14/56]
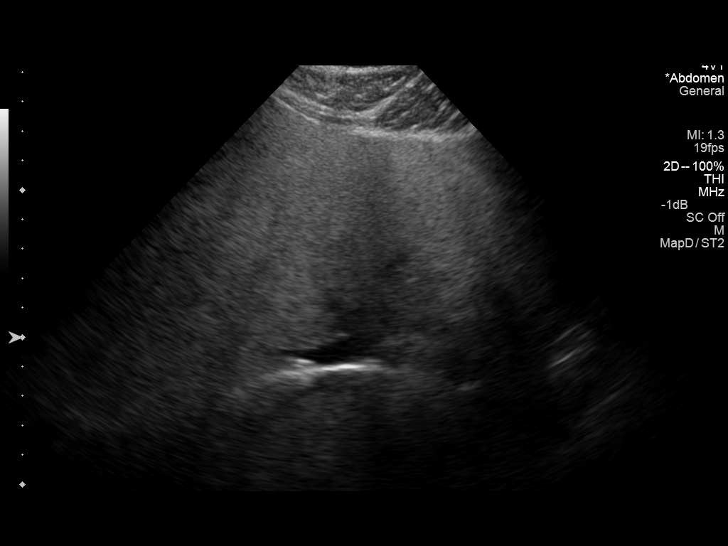
[im 19/56]
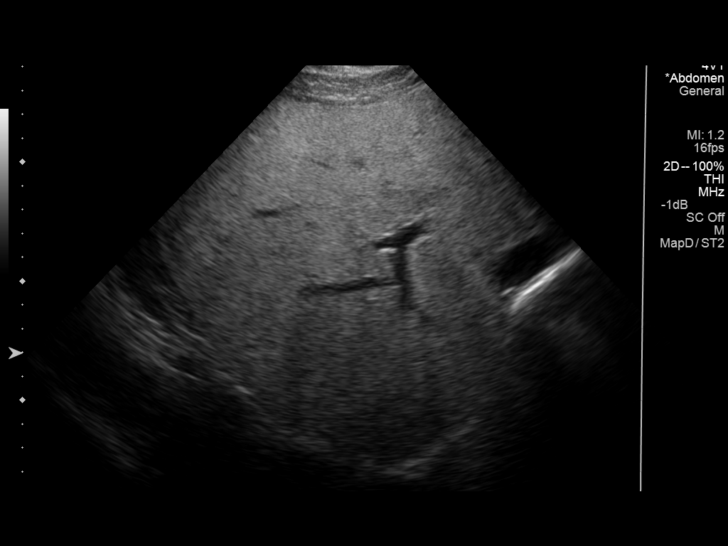
[im 21/56]
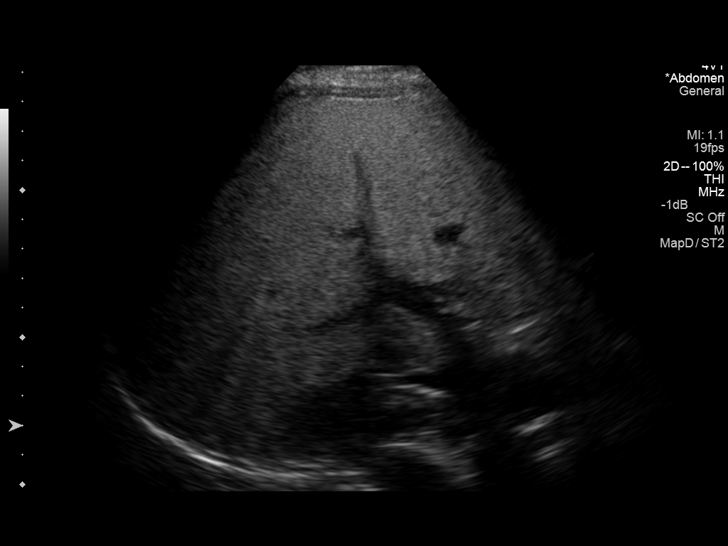
[im 26/56]
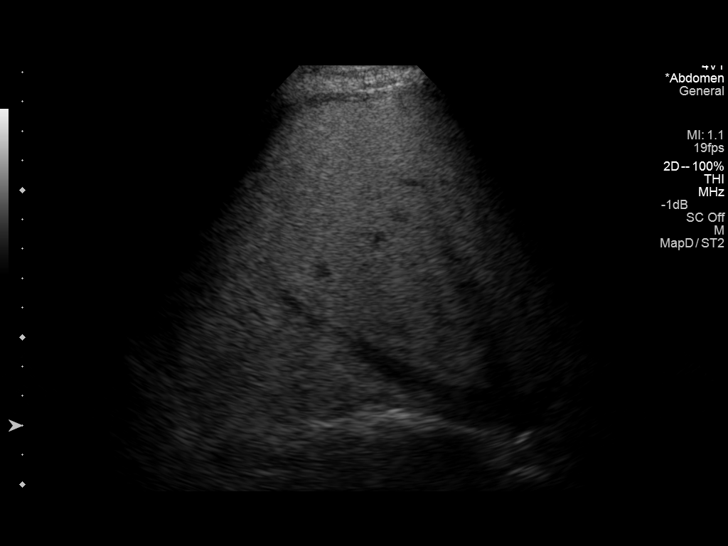
[im 30/56]
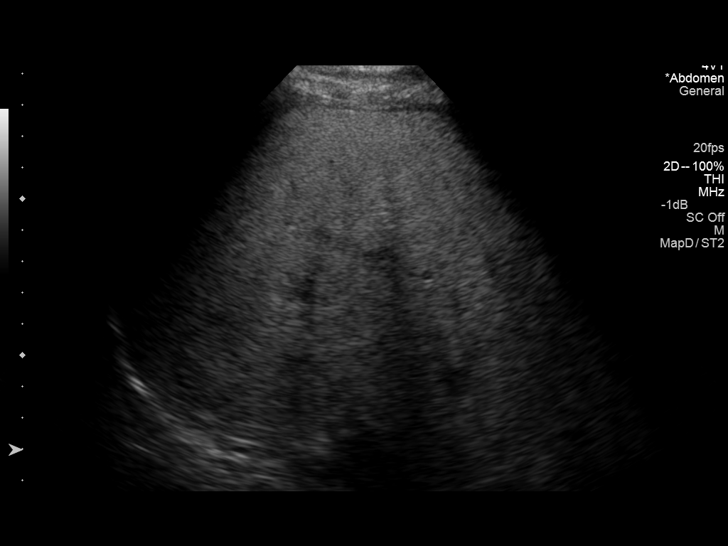
[im 35/56]
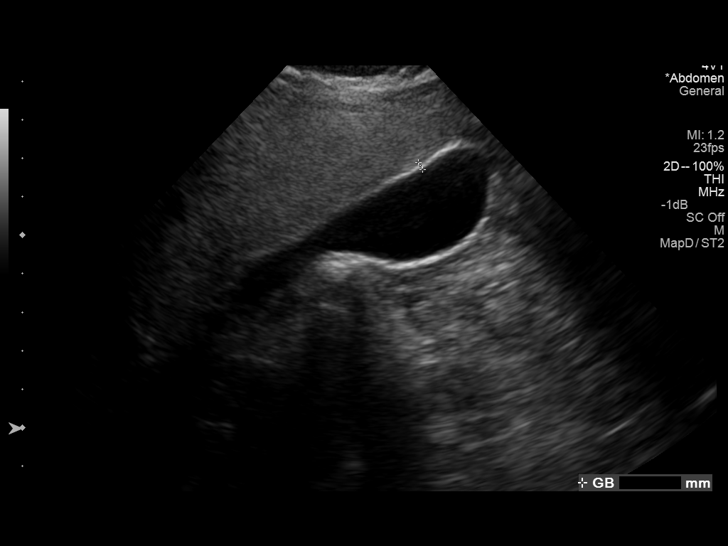
[im 37/56]
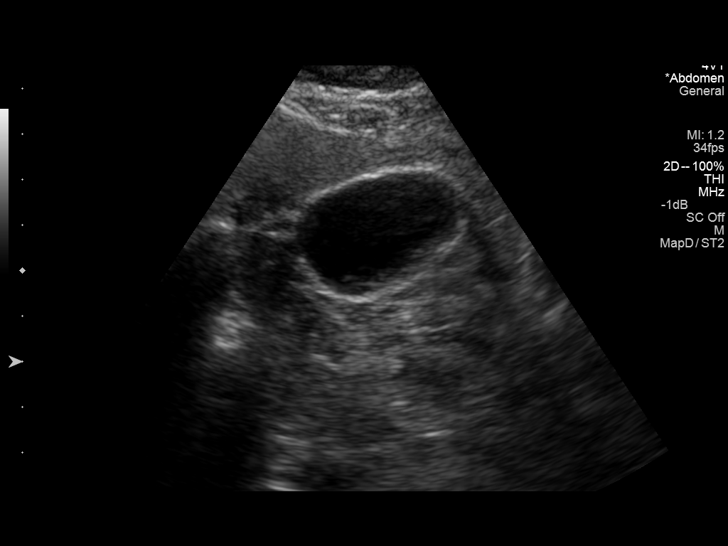
[im 42/56]
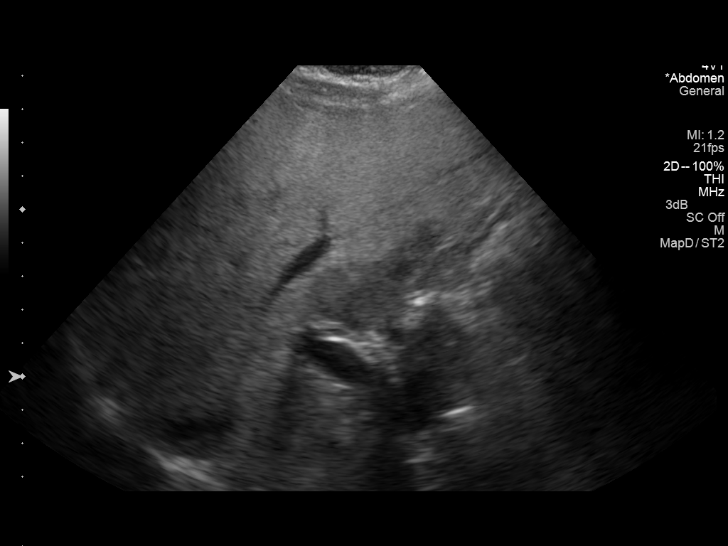
[im 46/56]
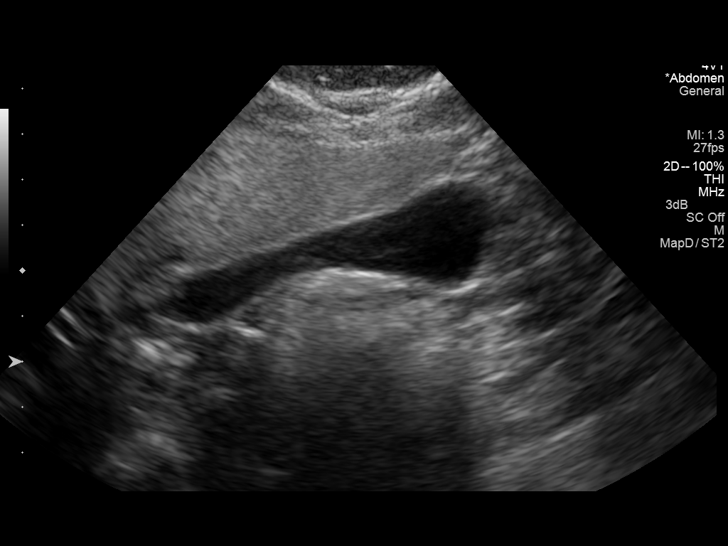
[im 51/56]
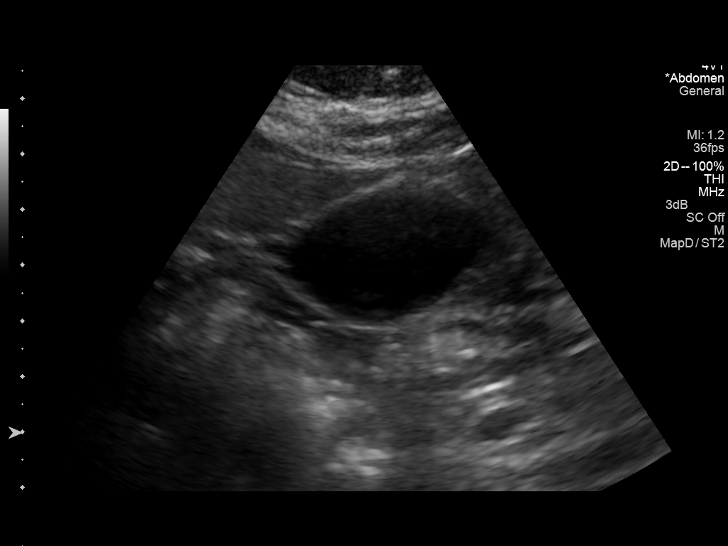
[im 56/56]
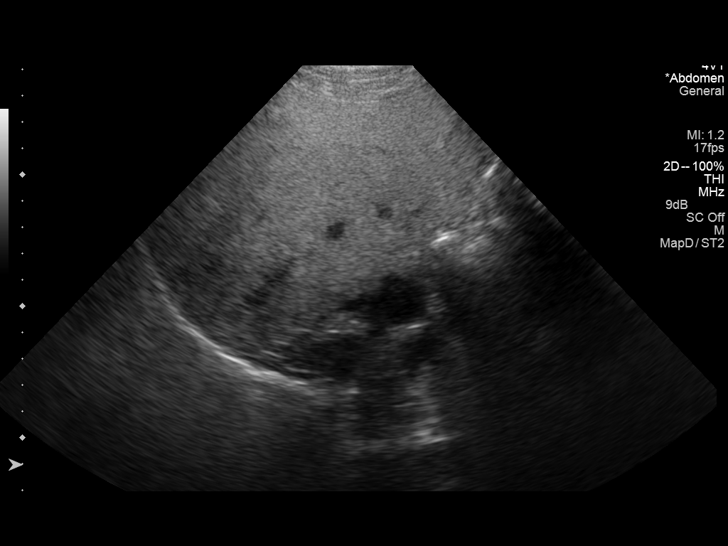

[14 of 25 positions shown; findings below may reference images not displayed]

FINDINGS: Gallbladder:

No gallstones or wall thickening visualized. No sonographic Murphy
sign noted.

Common bile duct:

Diameter: 3 mm, within normal limits.

Liver:

Diffusely increased in echogenicity.  No definite focal lesion.
IMPRESSION: Hepatic steatosis.

## 2016-04-18 NOTE — Progress Notes (Signed)
Patient ID: Bob Phillips, male   DOB: 1976-06-30, 40 y.o.   MRN: [id number]   Subjective:    Patient ID: Bob Phillips, male    DOB: 1976-06-30, 40 y.o.   MRN: [id number]  Patient presents for CPE Patient for complete physical exam. His medications are reviewed. Immunizations are up-to-date. His history of hyperlipidemia he is on Lipitor is also on fish oil and vitamins. He also uses Prilosec as needed for acid reflux He was in [place] a few weeks ago and had a gout attack he had swelling and pain in his great toe he went and his uric acid was 7.60 treated with prednisone shot as well as indomethacin after about 3 days his symptoms resolved. Uric acid was 7.6% his renal function was creatinine of 1.22    Review Of Systems:  GEN- denies fatigue, fever, weight loss,weakness, recent illness HEENT- denies eye drainage, change in vision, nasal discharge, CVS- denies chest pain, palpitations RESP- denies SOB, cough, wheeze ABD- denies N/V, change in stools, abd pain GU- denies dysuria, hematuria, dribbling, incontinence MSK- denies joint pain, muscle aches, injury Neuro- denies headache, dizziness, syncope, seizure activity       Objective:    BP 118/64 mmHg  Pulse 78  Temp(Src) 98.1 F (36.7 C) (Oral)  Resp 12  Ht 5\' 8"  (1.727 m)  Wt 189 lb (85.73 kg)  BMI 28.74 kg/m2 GEN- NAD, alert and oriented x3 HEENT- PERRL, EOMI, non injected sclera, pink conjunctiva, MMM, oropharynx clear Neck- Supple, no thyromegaly CVS- RRR, no murmur RESP-CTAB ABD-NABS,soft,NT,ND EXT- No edema Pulses- Radial, DP- 2+        Assessment & Plan:      Problem List Items Addressed This Visit    Routine general medical examination at a health care facility - Primary   Relevant Orders   CBC with Differential/Platelet   Comprehensive metabolic panel   TSH   Hyperlipidemia    Discussed avoiding red meat more lean protein he's also to try to incorporate more exercise      Relevant Orders    Lipid panel   GERD (gastroesophageal reflux disease)    Controlled with Prilosec as needed       Other Visit Diagnoses    Idiopathic gout of right foot, unspecified chronicity        Relevant Medications    indomethacin (INDOCIN) 50 MG capsule    Other Relevant Orders    Uric Acid       Note: This dictation was prepared with Dragon dictation along with smaller phrase technology. Any transcriptional errors that result from this process are unintentional.

## 2016-04-18 NOTE — Patient Instructions (Signed)
F/U in 1 year as needed

## 2016-04-18 NOTE — Assessment & Plan Note (Signed)
Controlled with Prilosec as needed

## 2016-04-18 NOTE — Assessment & Plan Note (Signed)
Discussed avoiding red meat more lean protein he's also to try to incorporate more exercise

## 2016-05-03 ENCOUNTER — Other Ambulatory Visit: Payer: Self-pay | Admitting: *Deleted

## 2016-05-03 ENCOUNTER — Encounter: Payer: Self-pay | Admitting: *Deleted

## 2016-05-03 DIAGNOSIS — R7989 Other specified abnormal findings of blood chemistry: Secondary | ICD-10-CM

## 2016-05-03 DIAGNOSIS — R945 Abnormal results of liver function studies: Principal | ICD-10-CM

## 2016-07-15 ENCOUNTER — Telehealth: Payer: BLUE CROSS/BLUE SHIELD | Admitting: Family

## 2016-07-15 DIAGNOSIS — J019 Acute sinusitis, unspecified: Secondary | ICD-10-CM

## 2016-07-15 MED ORDER — AMOXICILLIN-POT CLAVULANATE 875-125 MG PO TABS: 1.0000 | ORAL_TABLET | Freq: Two times a day (BID) | ORAL | 0 refills | Status: DC

## 2016-07-15 NOTE — Progress Notes (Signed)
We are sorry that you are not feeling well.  Here is how we plan to help!  Based on what you have shared with me it looks like you have sinusitis.  Sinusitis is inflammation and infection in the sinus cavities of the head.  Based on your presentation I believe you most likely have Acute Bacterial Sinusitis.  This is an infection caused by bacteria and is treated with antibiotics. I have prescribed Augmentin 875mg /125mg  one tablet twice daily with food, for 7 days. You may use an oral decongestant such as Mucinex D or if you have glaucoma or high blood pressure use plain Mucinex. Saline nasal spray help and can safely be used as often as needed for congestion.  If you develop worsening sinus pain, fever or notice severe headache and v ision changes, or if symptoms are not better after completion of antibiotic, please schedule an appointment with a health care provider.    Sinus infections are not as easily transmitted as other respiratory infection, however we still recommend that you avoid close contact with loved ones, especially the vvery young and elderly.  Remember to wash your hands thoroughly throughout the day as this is the number one way to prevent the spread of infection!  Home Care:  Only take medications as instructed by your medical team.  Complete the entire course of an antibiotic.  Do not take these medications with alcohol.  A steam or ultrasonic humidifier can help congestion.  You can place a towel over your head and breathe in the steam from hot water coming from a faucet.  Avoid close contacts especially the very young and the elderly.  Cover your mouth when you cough or sneeze.  Always remember to wash your hands.  Get Help Right Away If:  You develop worsening fever or sinus pain.  You develop a severe head ache or visual changes.  Your symptoms persist after you have completed your treatment plan.  Make sure you  Understand these instructions.  Will watch  your condition.  Will get help right away if you are not doing well or get worse.  Your e-visit answers were reviewed by a board certified advanced clinical practitioner to complete your personal care plan.  Depending on the condition, your plan could have included both over the counter or prescription medications.  If there is a problem please reply  once you have received a response from your provider.  Your safety is important to us.  If you have drug allergies check your prescription carefully.    You can use MyChart to ask questions about today's visit, request a non-urgent call back, or ask for a work or school excuse for 24 hours related to this e-Visit. If it has been greater than 24 hours you will need to follow up with your provider, or enter a new e-Visit to address those concerns.  You will get an e-mail in the next two days asking about your experience.  I hope that your e-visit has been valuable and will speed your recovery. Thank you for using e-visits.

## 2016-07-25 ENCOUNTER — Ambulatory Visit (INDEPENDENT_AMBULATORY_CARE_PROVIDER_SITE_OTHER): Payer: BLUE CROSS/BLUE SHIELD | Admitting: Family Medicine

## 2016-07-25 ENCOUNTER — Encounter: Payer: Self-pay | Admitting: Family Medicine

## 2016-07-25 VITALS — BP 128/64 | HR 78 | Temp 98.1°F | Resp 14 | Ht 68.0 in | Wt 188.0 lb

## 2016-07-25 DIAGNOSIS — J0100 Acute maxillary sinusitis, unspecified: Secondary | ICD-10-CM | POA: Diagnosis not present

## 2016-07-25 LAB — STREP GROUP A AG, W/REFLEX TO CULT: STREGTOCOCCUS GROUP A AG SCREEN: NOT DETECTED

## 2016-07-25 NOTE — Patient Instructions (Signed)
F/U as needed

## 2016-07-25 NOTE — Progress Notes (Signed)
   Subjective:    Patient ID: Bob Phillips, male    DOB: 06-Nov-1975, 40 y.o.   MRN: [id number]  Patient presents for Illness (x3 weeks- sinus pressure, sore throat, congestion) Issue here for recheck on sinusitis. 2 weeks ago he started with sinus pressure and significant frontal or maxillary pain and low-grade fever. He actually hadn't [facility] which is where he works. They prescribed him Augmentin which he completed yesterday. This drainage is now clear but he still has some pressure in his ears and mild sore throat. No significant cough.    Review Of Systems:  GEN- denies fatigue, fever, weight loss,weakness, recent illness HEENT- denies eye drainage, change in vision, nasal discharge, CVS- denies chest pain, palpitations RESP- denies SOB, cough, wheeze ABD- denies N/V, change in stools, abd pain GU- denies dysuria, hematuria, dribbling, incontinence MSK- denies joint pain, muscle aches, injury Neuro- denies headache, dizziness, syncope, seizure activity       Objective:    BP 128/64 (BP Location: Left Arm, Patient Position: Sitting, Cuff Size: Normal)   Pulse 78   Temp 98.1 F (36.7 C) (Oral)   Resp 14   Ht 5\' 8"  (1.727 m)   Wt 188 lb (85.3 kg)   SpO2 98% Comment: RA  BMI 28.59 kg/m  GEN- NAD, alert and oriented x3 HEENT- PERRL, EOMI, non injected sclera, pink conjunctiva, MMM, oropharynx clear , TM clear bilat no effusion,  No maxillary/frontal sinus tenderness,nares clear Neck- Supple, no LAD CVS- RRR, no murmur RESP-CTAB EXT- No edema Pulses- Radial 2+         Assessment & Plan:      Problem List Items Addressed This Visit    None    Visit Diagnoses    Acute maxillary sinusitis, recurrence not specified    -  Primary   Symptoms much improved, no further antibiotics, can use nasal saline, or netty pot   Relevant Orders   STREP GROUP A AG, W/REFLEX TO CULT (Completed)      Note: This dictation was prepared with Dragon dictation along with  smaller phrase technology. Any transcriptional errors that result from this process are unintentional.

## 2016-07-27 LAB — CULTURE, GROUP A STREP: Organism ID, Bacteria: NORMAL

## 2016-07-29 ENCOUNTER — Other Ambulatory Visit: Payer: Self-pay | Admitting: Family Medicine

## 2016-08-03 ENCOUNTER — Other Ambulatory Visit: Payer: Self-pay | Admitting: *Deleted

## 2016-08-03 MED ORDER — AMOXICILLIN-POT CLAVULANATE 875-125 MG PO TABS: 1.0000 | ORAL_TABLET | Freq: Two times a day (BID) | ORAL | 0 refills | Status: DC

## 2016-11-13 ENCOUNTER — Other Ambulatory Visit: Payer: Self-pay | Admitting: Family Medicine

## 2016-11-13 NOTE — Telephone Encounter (Signed)
rx filled per protocol  

## 2017-01-15 ENCOUNTER — Other Ambulatory Visit: Payer: Self-pay | Admitting: Family Medicine

## 2017-06-05 ENCOUNTER — Other Ambulatory Visit: Payer: Self-pay | Admitting: Family Medicine

## 2017-06-05 MED ORDER — PERMETHRIN 5 % EX CREA: 1.0000 "application " | TOPICAL_CREAM | Freq: Once | CUTANEOUS | 1 refills | Status: AC

## 2017-06-05 NOTE — Progress Notes (Signed)
   Wife dx with possible scabies, he does nto have symptoms but requested permethrin Sent to pharmacy

## 2017-06-23 ENCOUNTER — Other Ambulatory Visit: Payer: Self-pay | Admitting: Family Medicine

## 2017-07-04 ENCOUNTER — Other Ambulatory Visit: Payer: Self-pay | Admitting: Family Medicine

## 2017-08-14 ENCOUNTER — Other Ambulatory Visit: Payer: BLUE CROSS/BLUE SHIELD

## 2017-08-14 ENCOUNTER — Other Ambulatory Visit: Payer: Self-pay | Admitting: Family Medicine

## 2017-08-14 DIAGNOSIS — K219 Gastro-esophageal reflux disease without esophagitis: Secondary | ICD-10-CM

## 2017-08-14 DIAGNOSIS — E785 Hyperlipidemia, unspecified: Secondary | ICD-10-CM

## 2017-08-14 DIAGNOSIS — Z0000 Encounter for general adult medical examination without abnormal findings: Secondary | ICD-10-CM

## 2017-08-14 DIAGNOSIS — Z79899 Other long term (current) drug therapy: Secondary | ICD-10-CM

## 2017-08-14 LAB — CBC WITH DIFFERENTIAL/PLATELET
Basophils Absolute: 31 cells/uL (ref 0–200)
Basophils Relative: 0.6 %
EOS PCT: 2.8 %
Eosinophils Absolute: 143 cells/uL (ref 15–500)
HCT: 44.4 % (ref 38.5–50.0)
Hemoglobin: 14.9 g/dL (ref 13.2–17.1)
Lymphs Abs: 2479 cells/uL (ref 850–3900)
MCH: 28.9 pg (ref 27.0–33.0)
MCHC: 33.6 g/dL (ref 32.0–36.0)
MCV: 86.2 fL (ref 80.0–100.0)
MPV: 10.7 fL (ref 7.5–12.5)
Monocytes Relative: 10 %
NEUTROS PCT: 38 %
Neutro Abs: 1938 cells/uL (ref 1500–7800)
Platelets: 284 10*3/uL (ref 140–400)
RBC: 5.15 10*6/uL (ref 4.20–5.80)
RDW: 14.1 % (ref 11.0–15.0)
Total Lymphocyte: 48.6 %
WBC mixed population: 510 cells/uL (ref 200–950)
WBC: 5.1 10*3/uL (ref 3.8–10.8)

## 2017-08-14 LAB — LIPID PANEL
Cholesterol: 226 mg/dL — ABNORMAL HIGH (ref <200)
HDL: 41 mg/dL (ref >40)
LDL Cholesterol (Calc): 146 mg/dL (calc) — ABNORMAL HIGH
Non-HDL Cholesterol (Calc): 185 mg/dL (calc) — ABNORMAL HIGH (ref <130)
Total CHOL/HDL Ratio: 5.5 (calc) — ABNORMAL HIGH (ref <5.0)
Triglycerides: 240 mg/dL — ABNORMAL HIGH (ref <150)

## 2017-08-14 LAB — COMPLETE METABOLIC PANEL WITH GFR
AG RATIO: 1.9 (calc) (ref 1.0–2.5)
ALT: 66 U/L — ABNORMAL HIGH (ref 9–46)
AST: 47 U/L — ABNORMAL HIGH (ref 10–40)
Albumin: 5 g/dL (ref 3.6–5.1)
Alkaline phosphatase (APISO): 58 U/L (ref 40–115)
BUN: 16 mg/dL (ref 7–25)
CHLORIDE: 100 mmol/L (ref 98–110)
CO2: 28 mmol/L (ref 20–32)
Calcium: 10 mg/dL (ref 8.6–10.3)
Creat: 1.18 mg/dL (ref 0.60–1.35)
GFR, Est African American: 90 mL/min/{1.73_m2} (ref >=60)
GFR, Est Non African American: 77 mL/min/{1.73_m2} (ref >=60)
GLOBULIN: 2.7 g/dL (ref 1.9–3.7)
Glucose, Bld: 98 mg/dL (ref 65–99)
POTASSIUM: 4.4 mmol/L (ref 3.5–5.3)
Sodium: 137 mmol/L (ref 135–146)
TOTAL PROTEIN: 7.7 g/dL (ref 6.1–8.1)
Total Bilirubin: 1.2 mg/dL (ref 0.2–1.2)

## 2017-08-20 ENCOUNTER — Encounter: Payer: Self-pay | Admitting: Family Medicine

## 2017-08-20 ENCOUNTER — Ambulatory Visit (INDEPENDENT_AMBULATORY_CARE_PROVIDER_SITE_OTHER): Payer: BLUE CROSS/BLUE SHIELD | Admitting: Family Medicine

## 2017-08-20 VITALS — BP 132/78 | HR 54 | Temp 97.8°F | Resp 16 | Ht 68.0 in | Wt 202.0 lb

## 2017-08-20 DIAGNOSIS — M109 Gout, unspecified: Secondary | ICD-10-CM | POA: Insufficient documentation

## 2017-08-20 DIAGNOSIS — Z0000 Encounter for general adult medical examination without abnormal findings: Secondary | ICD-10-CM | POA: Diagnosis not present

## 2017-08-20 DIAGNOSIS — K760 Fatty (change of) liver, not elsewhere classified: Secondary | ICD-10-CM

## 2017-08-20 DIAGNOSIS — E6609 Other obesity due to excess calories: Secondary | ICD-10-CM

## 2017-08-20 DIAGNOSIS — M1000 Idiopathic gout, unspecified site: Secondary | ICD-10-CM

## 2017-08-20 DIAGNOSIS — K219 Gastro-esophageal reflux disease without esophagitis: Secondary | ICD-10-CM

## 2017-08-20 DIAGNOSIS — Z6830 Body mass index (BMI) 30.0-30.9, adult: Secondary | ICD-10-CM

## 2017-08-20 DIAGNOSIS — E66811 Other obesity due to excess calories: Secondary | ICD-10-CM

## 2017-08-20 DIAGNOSIS — E782 Mixed hyperlipidemia: Secondary | ICD-10-CM

## 2017-08-20 DIAGNOSIS — E669 Obesity, unspecified: Secondary | ICD-10-CM | POA: Insufficient documentation

## 2017-08-20 MED ORDER — INDOMETHACIN 50 MG PO CAPS: 50.0000 mg | ORAL_CAPSULE | Freq: Three times a day (TID) | ORAL | 1 refills | Status: AC

## 2017-08-20 MED ORDER — ATORVASTATIN CALCIUM 10 MG PO TABS: 10.0000 mg | ORAL_TABLET | Freq: Every day | ORAL | 2 refills | Status: DC

## 2017-08-20 MED ORDER — OMEPRAZOLE 40 MG PO CPDR: 40.0000 mg | DELAYED_RELEASE_CAPSULE | Freq: Every day | ORAL | 2 refills | Status: DC

## 2017-08-20 NOTE — Progress Notes (Addendum)
   Subjective:    Patient ID: Bob Phillips, male    DOB: 02-26-1976, 41 y.o.   MRN: [id number]  Patient presents for CPE (is fasting) and Sore Throat (states that he woke up with sore throat this AM- no other S/Sx of illness)  Pt here for CPE  Reviewed fasting labs Sore throat last night, no fever, no cough, took lozenge, nyquil   Due for Flu shot  Will get at work TDAP UTD  Discussed HIV screening    Gout- had only had 1 other mild flare since last visit in right great toe, would like to have indomethacin on hand he knows that beer and red meat causes flares.  GERD- omeprazole daily    Hyperlipidemia-taking Lipitor 10 mg however his LDL triglycerides and total cholesterol have gone up.  He hasGained 14 pounds since visit in Sept  2017.  He admits to not exercising regularly and has had some dietary indiscretions.  Family history reviewed   Review Of Systems:  GEN- denies fatigue, fever, weight loss,weakness, recent illness HEENT- denies eye drainage, change in vision, nasal discharge, CVS- denies chest pain, palpitations RESP- denies SOB, cough, wheeze ABD- denies N/V, change in stools, abd pain GU- denies dysuria, hematuria, dribbling, incontinence MSK- denies joint pain, muscle aches, injury Neuro- denies headache, dizziness, syncope, seizure activity       Objective:    BP 132/78   Pulse (!) 54   Temp 97.8 F (36.6 C) (Oral)   Resp 16   Ht 5\' 8"  (1.727 m)   Wt 202 lb (91.6 kg)   SpO2 98%   BMI 30.71 kg/m  GEN- NAD, alert and oriented x3 HEENT- PERRL, EOMI, non injected sclera, pink conjunctiva, MMM, oropharynx clear Neck- Supple, no thyromegaly CVS- RRR, no murmur RESP-CTAB ABD-NABS,soft,NT,ND EXT- No edema Pulses- Radial, DP- 2+        Assessment & Plan:     possible early viral illness or just change in weather, lozenges salt water gargle is fine for now Problem List Items Addressed This Visit      Unprioritized   Routine general medical  examination at a health care facility   Obese   Hyperlipidemia - Primary    CPE done, fasting labs reviewed, Flu at work Glycerol has been increasing due to weight gain and dietary changes.  Instead of increasing his Lipitor at this time he is going to work on getting his weight back down his last check in September labs look great but he was also eating right and exercising.  When I recheck his labs in 3 months before deciding to increase his statin.      Relevant Medications   atorvastatin (LIPITOR) 10 MG tablet   Hepatic steatosis   Gout    Very few flares he knows what to avoid in order to keep him from having gout.  I did refill the indomethacin so that he can have on hand.  If it seems like he is having increasing flares will put him on allopurinol      GERD (gastroesophageal reflux disease)    Doing well on the omeprazole      Relevant Medications   omeprazole (PRILOSEC) 40 MG capsule      Note: This dictation was prepared with Dragon dictation along with smaller phrase technology. Any transcriptional errors that result from this process are unintentional.

## 2017-08-20 NOTE — Patient Instructions (Addendum)
F/U LAB VISIT IN 3 MONTHS  F/U 6 months for Office visit

## 2017-08-20 NOTE — Assessment & Plan Note (Signed)
Doing well on the omeprazole

## 2017-08-20 NOTE — Assessment & Plan Note (Signed)
Very few flares he knows what to avoid in order to keep him from having gout.  I did refill the indomethacin so that he can have on hand.  If it seems like he is having increasing flares will put him on allopurinol

## 2017-08-20 NOTE — Assessment & Plan Note (Addendum)
CPE done, fasting labs reviewed, Flu at work Glycerol has been increasing due to weight gain and dietary changes.  Instead of increasing his Lipitor at this time he is going to work on getting his weight back down his last check in September labs look great but he was also eating right and exercising.  When I recheck his labs in 3 months before deciding to increase his statin.

## 2017-09-12 ENCOUNTER — Other Ambulatory Visit: Payer: Self-pay | Admitting: Family Medicine

## 2017-09-13 NOTE — Telephone Encounter (Signed)
Medication refilled per protocol. 

## 2017-09-23 ENCOUNTER — Other Ambulatory Visit: Payer: Self-pay | Admitting: Family Medicine

## 2017-11-20 ENCOUNTER — Other Ambulatory Visit: Payer: BLUE CROSS/BLUE SHIELD

## 2018-02-18 ENCOUNTER — Ambulatory Visit: Payer: BLUE CROSS/BLUE SHIELD | Admitting: Family Medicine

## 2018-02-27 ENCOUNTER — Other Ambulatory Visit: Payer: Self-pay | Admitting: *Deleted

## 2018-02-27 DIAGNOSIS — Z79899 Other long term (current) drug therapy: Secondary | ICD-10-CM

## 2018-02-27 DIAGNOSIS — Z0000 Encounter for general adult medical examination without abnormal findings: Secondary | ICD-10-CM

## 2018-02-27 DIAGNOSIS — E782 Mixed hyperlipidemia: Secondary | ICD-10-CM

## 2018-03-04 ENCOUNTER — Other Ambulatory Visit: Payer: Self-pay | Admitting: Family Medicine

## 2018-03-04 LAB — LIPID PANEL
Cholesterol: 193 mg/dL (ref <200)
HDL: 42 mg/dL (ref >40)
LDL Cholesterol (Calc): 128 mg/dL (calc) — ABNORMAL HIGH
Non-HDL Cholesterol (Calc): 151 mg/dL (calc) — ABNORMAL HIGH (ref <130)
TRIGLYCERIDES: 120 mg/dL (ref <150)
Total CHOL/HDL Ratio: 4.6 (calc) (ref <5.0)

## 2018-03-07 ENCOUNTER — Encounter: Payer: Self-pay | Admitting: Family Medicine

## 2018-03-07 ENCOUNTER — Ambulatory Visit (INDEPENDENT_AMBULATORY_CARE_PROVIDER_SITE_OTHER): Payer: 59 | Admitting: Family Medicine

## 2018-03-07 ENCOUNTER — Other Ambulatory Visit: Payer: Self-pay

## 2018-03-07 VITALS — BP 138/78 | HR 74 | Temp 98.1°F | Resp 14 | Ht 68.0 in | Wt 203.0 lb

## 2018-03-07 DIAGNOSIS — K760 Fatty (change of) liver, not elsewhere classified: Secondary | ICD-10-CM | POA: Diagnosis not present

## 2018-03-07 DIAGNOSIS — Z298 Encounter for other specified prophylactic measures: Secondary | ICD-10-CM | POA: Diagnosis not present

## 2018-03-07 DIAGNOSIS — E782 Mixed hyperlipidemia: Secondary | ICD-10-CM

## 2018-03-07 DIAGNOSIS — Z6830 Body mass index (BMI) 30.0-30.9, adult: Secondary | ICD-10-CM | POA: Diagnosis not present

## 2018-03-07 DIAGNOSIS — E6609 Other obesity due to excess calories: Secondary | ICD-10-CM

## 2018-03-07 MED ORDER — MEFLOQUINE HCL 250 MG PO TABS: ORAL_TABLET | ORAL | 0 refills | Status: AC

## 2018-03-07 MED ORDER — CIPROFLOXACIN HCL 500 MG PO TABS: 500.0000 mg | ORAL_TABLET | Freq: Two times a day (BID) | ORAL | 0 refills | Status: AC

## 2018-03-07 MED ORDER — ATORVASTATIN CALCIUM 20 MG PO TABS: 20.0000 mg | ORAL_TABLET | Freq: Every day | ORAL | 1 refills | Status: AC

## 2018-03-07 NOTE — Patient Instructions (Addendum)
F/U 6 MONTHS Physical  Mefloquine for prophylaxis  Cipro for travelors diarrhea

## 2018-03-07 NOTE — Progress Notes (Signed)
   Subjective:    Patient ID: Bob Phillips, male    DOB: 08-12-1976, 42 y.o.   MRN: [id number]  Patient presents for Follow-up (is fasting) Pt here to f/u hyperlipidemia. He has been taking lipitor , he doubled up after he kept missing some doses, but new he was not exercising eating as healthy so he continued No SE from the medication  Fasting labs reviewed, lipids much improved, LDL down to 128   He will be going to [place] in July for about 3 weeks, was treated for Malaria his last trip, would like prophylaxis     Review Of Systems:  GEN- denies fatigue, fever, weight loss,weakness, recent illness HEENT- denies eye drainage, change in vision, nasal discharge, CVS- denies chest pain, palpitations RESP- denies SOB, cough, wheeze ABD- denies N/V, change in stools, abd pain GU- denies dysuria, hematuria, dribbling, incontinence MSK- denies joint pain, muscle aches, injury Neuro- denies headache, dizziness, syncope, seizure activity       Objective:    BP 138/78   Pulse 74   Temp 98.1 F (36.7 C) (Oral)   Resp 14   Ht  (1.727 m)   Wt 203 lb (92.1 kg)   SpO2 95%   BMI 30.87 kg/m  GEN- NAD, alert and oriented x3 HEENT- PERRL, EOMI, non injected sclera, pink conjunctiva, MMM, oropharynx clear CVS- RRR, no murmur RESP-CTAB ABD-NABS,soft,NT,ND, no HSM  EXT- No edema Pulses- Radial 2+        Assessment & Plan:      Problem List Items Addressed This Visit      Unprioritized   Obese   Hepatic steatosis   Hyperlipidemia - Primary    Much improved, continue lipitor  Recheck lipids, LFT at next visit Encouraged dietary changes, need for 15-20lb weight loss       Relevant Medications   atorvastatin (LIPITOR) 20 MG tablet    Other Visit Diagnoses    Need for malaria prophylaxis       Mefloquine sent start  2 weeks before trip and continue 4 weeks after  and cipro for travelers diarrhea      Note: This dictation was prepared with Dragon dictation  along with smaller phrase technology. Any transcriptional errors that result from this process are unintentional.

## 2018-03-07 NOTE — Assessment & Plan Note (Signed)
Much improved, continue lipitor  Recheck lipids, LFT at next visit Encouraged dietary changes, need for 15-20lb weight loss

## 2018-05-07 ENCOUNTER — Telehealth: Payer: Self-pay | Admitting: Family Medicine

## 2018-05-07 NOTE — Telephone Encounter (Signed)
Pt wife was here in the office this morning with her parents Asked about his mefloquine prophylaxis He had taken 5 days daily as directed on the bottle, started to get nausea and dizziness he is currently in [place]  I reviewed the script , should be once a week starting 2 weeks before, weekly while in [place] and for additional 4 weeks -weekly when he returns He stopped the meds due to symptoms will start once a week, this Friday. Wife will relay message since he is in [place] and she has a way to contact him

## 2018-06-22 ENCOUNTER — Other Ambulatory Visit: Payer: Self-pay | Admitting: Family Medicine

## 2018-09-15 ENCOUNTER — Other Ambulatory Visit: Payer: Self-pay | Admitting: Family Medicine

## 2018-10-06 ENCOUNTER — Encounter: Payer: 59 | Admitting: Family Medicine
